# Patient Record
Sex: Male | Born: 1950
Health system: Southern US, Community
[De-identification: ages and names within clinical notes are randomized; demographics above are authoritative.]

## PROBLEM LIST (undated history)

## (undated) DIAGNOSIS — E785 Hyperlipidemia, unspecified: Secondary | ICD-10-CM

## (undated) DIAGNOSIS — E7800 Pure hypercholesterolemia, unspecified: Secondary | ICD-10-CM

## (undated) DIAGNOSIS — N529 Male erectile dysfunction, unspecified: Secondary | ICD-10-CM

## (undated) DIAGNOSIS — R609 Edema, unspecified: Secondary | ICD-10-CM

## (undated) DIAGNOSIS — I10 Essential (primary) hypertension: Secondary | ICD-10-CM

## (undated) HISTORY — DX: Edema, unspecified: R60.9

## (undated) HISTORY — DX: Pure hypercholesterolemia, unspecified: E78.00

## (undated) HISTORY — DX: Male erectile dysfunction, unspecified: N52.9

## (undated) HISTORY — PX: FOOT SURGERY: SHX648

## (undated) HISTORY — PX: COLONOSCOPY: SHX174

## (undated) HISTORY — DX: Hyperlipidemia, unspecified: E78.5

## (undated) HISTORY — DX: Essential (primary) hypertension: I10

---

## 1997-09-28 ENCOUNTER — Ambulatory Visit (HOSPITAL_COMMUNITY): Admission: RE | Admit: 1997-09-28 | Discharge: 1997-09-28 | Payer: Self-pay | Admitting: Family Medicine

## 1997-12-28 ENCOUNTER — Encounter: Admission: RE | Admit: 1997-12-28 | Discharge: 1997-12-28 | Payer: Self-pay | Admitting: *Deleted

## 2002-07-12 ENCOUNTER — Ambulatory Visit (HOSPITAL_COMMUNITY): Admission: RE | Admit: 2002-07-12 | Discharge: 2002-07-12 | Payer: Self-pay | Admitting: Family Medicine

## 2002-07-12 ENCOUNTER — Encounter: Payer: Self-pay | Admitting: Family Medicine

## 2005-07-25 ENCOUNTER — Emergency Department (HOSPITAL_COMMUNITY): Admission: EM | Admit: 2005-07-25 | Discharge: 2005-07-25 | Payer: Self-pay | Admitting: Emergency Medicine

## 2006-10-14 ENCOUNTER — Emergency Department (HOSPITAL_COMMUNITY): Admission: EM | Admit: 2006-10-14 | Discharge: 2006-10-14 | Payer: Self-pay | Admitting: Emergency Medicine

## 2007-08-20 ENCOUNTER — Encounter: Admission: RE | Admit: 2007-08-20 | Discharge: 2007-08-20 | Payer: Self-pay | Admitting: Interventional Cardiology

## 2008-04-05 ENCOUNTER — Ambulatory Visit (HOSPITAL_COMMUNITY): Admission: RE | Admit: 2008-04-05 | Discharge: 2008-04-05 | Payer: Self-pay | Admitting: Urology

## 2008-09-13 ENCOUNTER — Encounter (INDEPENDENT_AMBULATORY_CARE_PROVIDER_SITE_OTHER): Payer: Self-pay | Admitting: *Deleted

## 2010-11-06 NOTE — Op Note (Signed)
NAMEJOSHUE, BADAL               ACCOUNT NO.:  [id number]   MEDICAL RECORD NO.:  [id number]          PATIENT TYPE:  AMB   LOCATION:  DAY                          FACILITY:  [facility]   PHYSICIAN:  Excell Seltzer. Annabell Howells, M.D.    DATE OF BIRTH:  [birth year]   DATE OF PROCEDURE:  04/05/2008  DATE OF DISCHARGE:                               OPERATIVE REPORT   PROCEDURE:  Bilateral hydrocelectomy.   PREOPERATIVE DIAGNOSIS:  Bilateral hydroceles.   POSTOPERATIVE DIAGNOSIS:  Bilateral hydroceles.   SURGEON:  Excell Seltzer. Annabell Howells, MD   ANESTHESIA:  General.   SPECIMEN:  None.   DRAINS:  Bilateral 1/4-inch Penrose scrotal drains.   COMPLICATIONS:  None.   INDICATIONS:  Mr. Tony is a 60 year old white male with two large  hydroceles, left greater than right, who has elected surgical  correction.   FINDINGS AND PROCEDURE:  The patient is taken to the operating room and  general anesthetic was induced.  His scrotum was clipped.  He was  prepped with Betadine solution and draped in the usual sterile fashion.  A midline incision was made along the scrotal raphe with a knife.  This  was carried to the dartos with the Bovie.  Initially, the left hydrocele  sac was exposed and delivered from the wound.  The sac was drained of  700 mL of yellow fluid and the excess sac was excised and the remainder  was imbricated behind the testicle in a water bottle fashion using a  running locked 3-0 chromic suture.  The left testicle was returned to  the left hemi scrotum once hemostasis was assured.  The right testicle  was then delivered through the same incision and the hydrocele was  drained.  The right hydrocele had 200 mL of yellow fluid.  The excess  sac was excised.  The residual sac was imbricated behind the testicle in  a water bottle fashion with a running locked 3-0 chromic suture.  The  right testicle was returned to the scrotum once hemostasis was assured.  A 1/4-inch Penrose drains were placed through  separate stab wounds in  the dependent portion of each hemi scrotum and were temporarily secured  with towel clips.  The wound was then closed in two layers using a  running 3-0 chromic on the dartos layer and running vertical mattress 3-  0 chromic on the skin with great care being taken to avoid entrapment of  the drains.  Once the incision had been closed, the wound was  cleansed.  The towel clips were removed from the drains and a 4 x 4s  were placed followed by fluff Kerlix and a scrotal support.  The  patient's anesthetic was reversed, he was moved to the recovery room in  stable condition.  There were no complications.      Excell Seltzer. Annabell Howells, M.D.  Electronically Signed     JJW/MEDQ  D:  04/05/2008  T:  04/06/2008  Job:  [id number]

## 2011-03-25 LAB — BASIC METABOLIC PANEL
BUN: 11
CO2: 32
Calcium: 9.4
Chloride: 104
Creatinine, Ser: 1.06
GFR calc Af Amer: >60
GFR calc non Af Amer: >60
Glucose, Bld: 111 — ABNORMAL HIGH
Potassium: 3.7
Sodium: 141

## 2011-03-25 LAB — HEMOGLOBIN AND HEMATOCRIT, BLOOD
HCT: 45.4
Hemoglobin: 14.9

## 2013-03-24 ENCOUNTER — Encounter: Payer: Self-pay | Admitting: *Deleted

## 2013-03-24 ENCOUNTER — Encounter: Payer: Self-pay | Admitting: Interventional Cardiology

## 2013-03-24 DIAGNOSIS — R609 Edema, unspecified: Secondary | ICD-10-CM | POA: Insufficient documentation

## 2013-03-24 DIAGNOSIS — I10 Essential (primary) hypertension: Secondary | ICD-10-CM | POA: Insufficient documentation

## 2013-03-24 DIAGNOSIS — N529 Male erectile dysfunction, unspecified: Secondary | ICD-10-CM | POA: Insufficient documentation

## 2013-03-24 DIAGNOSIS — E785 Hyperlipidemia, unspecified: Secondary | ICD-10-CM | POA: Insufficient documentation

## 2013-03-24 DIAGNOSIS — E7800 Pure hypercholesterolemia, unspecified: Secondary | ICD-10-CM | POA: Insufficient documentation

## 2013-03-31 ENCOUNTER — Encounter: Payer: Self-pay | Admitting: Interventional Cardiology

## 2013-03-31 ENCOUNTER — Ambulatory Visit (INDEPENDENT_AMBULATORY_CARE_PROVIDER_SITE_OTHER): Payer: Managed Care, Other (non HMO) | Admitting: Interventional Cardiology

## 2013-03-31 VITALS — BP 174/102 | HR 99 | Ht 67.0 in | Wt 205.0 lb

## 2013-03-31 DIAGNOSIS — I10 Essential (primary) hypertension: Secondary | ICD-10-CM

## 2013-03-31 DIAGNOSIS — E785 Hyperlipidemia, unspecified: Secondary | ICD-10-CM

## 2013-03-31 DIAGNOSIS — Z79899 Other long term (current) drug therapy: Secondary | ICD-10-CM

## 2013-03-31 DIAGNOSIS — N529 Male erectile dysfunction, unspecified: Secondary | ICD-10-CM

## 2013-03-31 NOTE — Progress Notes (Signed)
Patient ID: Benjamin Vega, male   DOB: Jun 24, 1951, 62 y.o.   MRN: [id number]    [address] [place], [place]  [address] Phone: [phone] Fax:  [phone]  Date:  04/02/2013   ID:  Benjamin Vega, DOB [birth year], MRN [id number]  PCP:  No primary provider on file.      History of Present Illness: [name] is a 62 y.o. male who has needed multiple medications to control BP. Some fatigue in the past, but better on less coreg. Hypertension: rare edema in the legs.  Rare facial swelling.   c/o Chest pain Only with over eating. No relation to exertion..  Denies : Dizziness.  .  Orthopnea.  Paroxysmal nocturnal dyspnea.  Palpitations.  Dyspnea.  Syncope.     Wt Readings from Last 3 Encounters:  03/31/13 205 lb (92.987 kg)     Past Medical History  Diagnosis Date  . Erectile dysfunction   . Edema   . HTN (hypertension)   . Hypercholesteremia   . Hyperlipemia     Current Outpatient Prescriptions  Medication Sig Dispense Refill  . amlodipine-atorvastatin (CADUET) 10-10 MG per tablet Take 1 tablet by mouth daily.      Marland Kitchen aspirin 325 MG tablet Take 325 mg by mouth daily.      . carvedilol (COREG) 12.5 MG tablet Take 6.25 mg by mouth 2 (two) times daily with a meal.      . olmesartan (BENICAR) 40 MG tablet Take 40 mg by mouth daily.      Marland Kitchen spironolactone (ALDACTONE) 25 MG tablet Take 25 mg by mouth daily.      . tadalafil (CIALIS) 20 MG tablet Take 20 mg by mouth daily as needed for erectile dysfunction.      . timolol (TIMOPTIC) 0.5 % ophthalmic solution        No current facility-administered medications for this visit.    Allergies:   Not on File, question of angioedema to ACE-I.  Has tolerated ARB.  Social History:  The patient  reports that he has quit smoking. He does not have any smokeless tobacco history on file.   Family History:  The patient's family history includes Diabetes in his mother; Hypertension in his father.   ROS:  Please see  the history of present illness.  No nausea, vomiting.  No fevers, chills.  No focal weakness.  No dysuria.  All other systems reviewed and negative.   PHYSICAL EXAM: VS:  BP 174/102  Pulse 99  Ht 5\' 7"  (1.702 m)  Wt 205 lb (92.987 kg)  BMI 32.1 kg/m2 Well nourished, well developed, in no acute distress HEENT: normal Neck: no JVD, no carotid bruits Cardiac:  normal S1, S2; RRR;  Lungs:  clear to auscultation bilaterally, no wheezing, rhonchi or rales Abd: soft, nontender, no hepatomegaly Ext: no edema Skin: warm and dry Neuro:   no focal abnormalities noted    ASSESSMENT AND PLAN:  Treatment  1. Hypertension, essential  Continue Benicar Tablet, 40 MG, 1 tablet, Orally, Once a day, 90, Refills 3 Continue Coreg Tablet, 6.25 MG, one tablet, Orally, Twice a day, 180, Refills 3 Continue Spironolactone Tablet, 25 MG, 1 tablet, Orally, daily, 90, Refills 3 Diagnostic Imaging:EKG Harward,Amy 07/06/2012 03:40:41 PM > VARANASI,JAY 07/06/2012 04:09:08 PM > NSR, no ST segment changes  Well controlled. Most readings in the 110-120 range systolic. COuld try reducing Coreg to half tab (3.125 mg) twice a day, especially given ED. If BP rises,  would go back up to 6.25 mg BID. He will try if his readings remain low.    2. Mixed hyperlipidemia Lipids in Jan 2014 were reviewed and well controlled.  Continue Caduet Tablet, 10-10 MG, 1 tablet, Orally, qd, 90, Refills 3 LAB: Comp Metabolic Panel (Ordered for 07/08/2012)   GLUCOSE 99 70-99 - mg/dL   BUN 18 4-09 - mg/dL   CREATININE 8.11 9.14-7.82 - mg/dl   eGFR (NON-AFRICAN AMERICAN) 76 >60 - calc   eGFR (AFRICAN AMERICAN) 92 >60 - calc   SODIUM 140 136-145 - mmol/L   POTASSIUM 4.0 3.5-5.5 - mmol/L   CHLORIDE 103 98-107 - mmol/L   C02 28 22-32 - mmol/L   ANION GAP 12.6 6.0-20.0 - mmol/L   CALCIUM 9.4 8.6-10.3 - mg/dL   T PROTEIN 7.4 9.5-6.2 - g/dL   ALBUMIN 4.5 1.3-0.8 - g/dL   T.BILI 0.6 0.3-1.0 - mg/dL   ALP 58 65-784 - U/L   AST 26 0-39 -  U/L   ALT 26 0-52 - U/L    VARANASI,JAY 07/09/2012 11:30:25 AM > normal kidney and liver function. Harward,Amy 07/09/2012 02:17:27 PM > lmtrc Harward,Amy 07/09/2012 04:48:29 PM > Pt notified.   LAB: Lipid Panel w/Direct LDL (Ordered for 07/08/2012)   CHOLESTEROL 127 <200 - mg/dL   TRIG 73 6-962 - mg/dL   DIRECT HDL 47 95-28 - mg/dL   DLDL 61 4-13 - mg/dL   NON-HDL 80 2-440 - mg/dL   CHOL/HDL 2.7 1.0-2.7 - Ratio    VARANASI,JAY 07/09/2012 11:29:38 AM > normal. continue current meds. Harward,Amy 07/09/2012 02:17:21 PM > lmtrc Harward,Amy 07/09/2012 04:48:01 PM > Pt notified.   LDL 60.    3. Erectile dysfunction  Continue Cialis Tablet, 20 MG, TAKE 1 TABLET BY MOUTH 1 HOUR PRIOR TO SEXUAL ACTIVITY, oral, prn, 30 days, 10, Refills 3 OK to continue this. Not using NTG.      Signed, Fredric Mare, MD, [facility] 04/02/2013 2:24 PM

## 2013-03-31 NOTE — Patient Instructions (Signed)
Your physician wants you to follow-up in: 1 year with Dr. Eldridge Dace. You will receive a reminder letter in the mail two months in advance. If you don't receive a letter, please call our office to schedule the follow-up appointment.  Your physician recommends that you return for lab work in: today for bmet, lipid and alt. Also, in 6 months for a BMET.  Your physician recommends that you continue on your current medications as directed. Please refer to the Current Medication list given to you today.

## 2013-04-07 ENCOUNTER — Other Ambulatory Visit (INDEPENDENT_AMBULATORY_CARE_PROVIDER_SITE_OTHER): Payer: Managed Care, Other (non HMO)

## 2013-04-07 DIAGNOSIS — Z79899 Other long term (current) drug therapy: Secondary | ICD-10-CM

## 2013-04-07 LAB — BASIC METABOLIC PANEL
BUN: 15 mg/dL (ref 6–23)
Calcium: 9.2 mg/dL (ref 8.4–10.5)
Chloride: 100 mEq/L (ref 96–112)
Creatinine, Ser: 1.1 mg/dL (ref 0.4–1.5)
GFR: 84.87 mL/min (ref >60.00)
Sodium: 139 mEq/L (ref 135–145)

## 2013-04-08 ENCOUNTER — Telehealth: Payer: Self-pay | Admitting: Interventional Cardiology

## 2013-04-08 NOTE — Telephone Encounter (Signed)
New problem     Pt called about labs 10/15 please give him a call back

## 2013-08-12 ENCOUNTER — Other Ambulatory Visit: Payer: Self-pay | Admitting: *Deleted

## 2013-08-12 ENCOUNTER — Telehealth: Payer: Self-pay | Admitting: Interventional Cardiology

## 2013-08-12 MED ORDER — AMLODIPINE-ATORVASTATIN 10-10 MG PO TABS: 1.0000 | ORAL_TABLET | Freq: Every day | ORAL | Status: DC

## 2013-08-12 MED ORDER — CARVEDILOL 12.5 MG PO TABS: 6.2500 mg | ORAL_TABLET | Freq: Two times a day (BID) | ORAL | Status: DC

## 2013-08-12 MED ORDER — OLMESARTAN MEDOXOMIL 40 MG PO TABS: 40.0000 mg | ORAL_TABLET | Freq: Every day | ORAL | Status: DC

## 2013-08-12 MED ORDER — SPIRONOLACTONE 25 MG PO TABS: 25.0000 mg | ORAL_TABLET | Freq: Every day | ORAL | Status: DC

## 2013-09-01 ENCOUNTER — Other Ambulatory Visit: Payer: Self-pay | Admitting: Cardiology

## 2013-09-01 MED ORDER — TADALAFIL 20 MG PO TABS: 20.0000 mg | ORAL_TABLET | Freq: Every day | ORAL | Status: DC | PRN

## 2013-09-29 ENCOUNTER — Ambulatory Visit (INDEPENDENT_AMBULATORY_CARE_PROVIDER_SITE_OTHER): Payer: BC Managed Care – PPO | Admitting: *Deleted

## 2013-09-29 DIAGNOSIS — I10 Essential (primary) hypertension: Secondary | ICD-10-CM

## 2013-09-29 LAB — BASIC METABOLIC PANEL
BUN: 21 mg/dL (ref 6–23)
CO2: 28 meq/L (ref 19–32)
CREATININE: 1 mg/dL (ref 0.4–1.5)
Calcium: 9.7 mg/dL (ref 8.4–10.5)
Chloride: 103 mEq/L (ref 96–112)
GFR: 98.87 mL/min (ref >60.00)
Glucose, Bld: 109 mg/dL — ABNORMAL HIGH (ref 70–99)
POTASSIUM: 3.8 meq/L (ref 3.5–5.1)
Sodium: 138 mEq/L (ref 135–145)

## 2013-10-20 NOTE — Telephone Encounter (Signed)
error 

## 2013-12-27 ENCOUNTER — Other Ambulatory Visit: Payer: Self-pay | Admitting: *Deleted

## 2013-12-27 MED ORDER — CARVEDILOL 6.25 MG PO TABS: 6.2500 mg | ORAL_TABLET | Freq: Two times a day (BID) | ORAL | Status: DC

## 2014-02-09 ENCOUNTER — Other Ambulatory Visit: Payer: Self-pay | Admitting: Cardiology

## 2014-02-09 MED ORDER — TADALAFIL 20 MG PO TABS: 20.0000 mg | ORAL_TABLET | Freq: Every day | ORAL | Status: DC | PRN

## 2014-02-10 NOTE — Telephone Encounter (Signed)
error 

## 2014-03-18 ENCOUNTER — Ambulatory Visit (INDEPENDENT_AMBULATORY_CARE_PROVIDER_SITE_OTHER): Payer: BC Managed Care – PPO | Admitting: Cardiology

## 2014-03-18 ENCOUNTER — Encounter: Payer: Self-pay | Admitting: Cardiology

## 2014-03-18 ENCOUNTER — Ambulatory Visit: Payer: BC Managed Care – PPO

## 2014-03-18 VITALS — BP 130/88 | HR 61 | Ht 67.0 in | Wt 213.0 lb

## 2014-03-18 DIAGNOSIS — I10 Essential (primary) hypertension: Secondary | ICD-10-CM

## 2014-03-18 DIAGNOSIS — R7309 Other abnormal glucose: Secondary | ICD-10-CM

## 2014-03-18 DIAGNOSIS — Z79899 Other long term (current) drug therapy: Secondary | ICD-10-CM

## 2014-03-18 DIAGNOSIS — Z833 Family history of diabetes mellitus: Secondary | ICD-10-CM

## 2014-03-18 DIAGNOSIS — N528 Other male erectile dysfunction: Secondary | ICD-10-CM

## 2014-03-18 DIAGNOSIS — N529 Male erectile dysfunction, unspecified: Secondary | ICD-10-CM

## 2014-03-18 LAB — COMPREHENSIVE METABOLIC PANEL
ALT: 22 U/L (ref 0–53)
AST: 24 U/L (ref 0–37)
Albumin: 4.4 g/dL (ref 3.5–5.2)
Alkaline Phosphatase: 53 U/L (ref 39–117)
BUN: 20 mg/dL (ref 6–23)
CO2: 27 mEq/L (ref 19–32)
Calcium: 9.9 mg/dL (ref 8.4–10.5)
Chloride: 102 mEq/L (ref 96–112)
Creatinine, Ser: 1.2 mg/dL (ref 0.4–1.5)
GFR: 79.68 mL/min (ref >60.00)
Glucose, Bld: 115 mg/dL — ABNORMAL HIGH (ref 70–99)
Potassium: 4.2 mEq/L (ref 3.5–5.1)
Sodium: 136 mEq/L (ref 135–145)
Total Bilirubin: 0.5 mg/dL (ref 0.2–1.2)
Total Protein: 7.7 g/dL (ref 6.0–8.3)

## 2014-03-18 LAB — CBC WITH DIFFERENTIAL/PLATELET
Basophils Absolute: 0 10*3/uL (ref 0.0–0.1)
Basophils Relative: 0.4 % (ref 0.0–3.0)
Eosinophils Absolute: 0.1 10*3/uL (ref 0.0–0.7)
Eosinophils Relative: 2.2 % (ref 0.0–5.0)
HCT: 45.1 % (ref 39.0–52.0)
Hemoglobin: 14.7 g/dL (ref 13.0–17.0)
Lymphocytes Relative: 27.9 % (ref 12.0–46.0)
Lymphs Abs: 1.1 10*3/uL (ref 0.7–4.0)
MCHC: 32.6 g/dL (ref 30.0–36.0)
MCV: 94 fl (ref 78.0–100.0)
Monocytes Absolute: 0.4 10*3/uL (ref 0.1–1.0)
Monocytes Relative: 9.7 % (ref 3.0–12.0)
Neutro Abs: 2.4 10*3/uL (ref 1.4–7.7)
Neutrophils Relative %: 59.8 % (ref 43.0–77.0)
Platelets: 206 10*3/uL (ref 150.0–400.0)
RBC: 4.8 Mil/uL (ref 4.22–5.81)
RDW: 15.8 % — ABNORMAL HIGH (ref 11.5–15.5)
WBC: 4.1 10*3/uL (ref 4.0–10.5)

## 2014-03-18 LAB — HEMOGLOBIN A1C: Hgb A1c MFr Bld: 6.4 % (ref 4.6–6.5)

## 2014-03-18 NOTE — Assessment & Plan Note (Signed)
[place]ais prn

## 2014-03-18 NOTE — Assessment & Plan Note (Signed)
On Caduet

## 2014-03-18 NOTE — Assessment & Plan Note (Signed)
Controlled.  

## 2014-03-18 NOTE — Assessment & Plan Note (Signed)
His last glucose was slightly elevated and the pt asked to be checked for DM.

## 2014-03-18 NOTE — Patient Instructions (Signed)
Your physician recommends that you continue on your current medications as directed. Please refer to the Current Medication list given to you today.  Your physician recommends that you go to the lab today for a CBC, BMET, and Hgb A1C  Your physician wants you to follow-up in: 1 year with Dr Glennon Hamilton will receive a reminder letter in the mail two months in advance. If you don't receive a letter, please call our office to schedule the follow-up appointment.

## 2014-03-18 NOTE — Progress Notes (Signed)
    03/18/2014 Benjamin Vega   1951-03-16  [id number]  Primary Physicia No primary provider on file. Primary Cardiologist: Dr Irish Lack  HPI:  63 y/o followed by Dr Irish Lack and Dr Jeremy Johann. He has a history of HTN that had previously been difficult to control. He is her for an annual check up. He denies any chest pain or dyspnea. He brought in a record of his B/P at home and his B/P is well controlled.    Current Outpatient Prescriptions  Medication Sig Dispense Refill  . amlodipine-atorvastatin (CADUET) 10-10 MG per tablet Take 1 tablet by mouth daily.  90 tablet  2  . aspirin 325 MG tablet Take 325 mg by mouth daily.      . carvedilol (COREG) 6.25 MG tablet Take 1 tablet (6.25 mg total) by mouth 2 (two) times daily.  180 tablet  0  . COMBIGAN 0.2-0.5 % ophthalmic solution       . olmesartan (BENICAR) 40 MG tablet Take 1 tablet (40 mg total) by mouth daily.  90 tablet  2  . spironolactone (ALDACTONE) 25 MG tablet Take 1 tablet (25 mg total) by mouth daily.  90 tablet  2  . tadalafil (CIALIS) 20 MG tablet Take 1 tablet (20 mg total) by mouth daily as needed for erectile dysfunction.  10 tablet  3  . timolol (TIMOPTIC) 0.5 % ophthalmic solution        No current facility-administered medications for this visit.    No Known Allergies  History   Social History  . Marital Status: Married    Spouse Name: N/A    Number of Children: N/A  . Years of Education: N/A   Occupational History  . Not on file.   Social History Main Topics  . Smoking status: Former Research scientist (life sciences)  . Smokeless tobacco: Not on file  . Alcohol Use: Not on file  . Drug Use: Not on file  . Sexual Activity: Not on file   Other Topics Concern  . Not on file   Social History Narrative  . No narrative on file     Review of Systems: General: negative for chills, fever, night sweats or weight changes.  Cardiovascular: negative for chest pain, dyspnea on exertion, edema, orthopnea, palpitations, paroxysmal  nocturnal dyspnea or shortness of breath Dermatological: negative for rash Respiratory: negative for cough or wheezing Urologic: negative for hematuria Abdominal: negative for nausea, vomiting, diarrhea, bright red blood per rectum, melena, or hematemesis Neurologic: negative for visual changes, syncope, or dizziness All other systems reviewed and are otherwise negative except as noted above.    Blood pressure 130/88, pulse 61, height 5\' 7"  (1.702 m), weight 213 lb (96.616 kg).  General appearance: alert, cooperative and no distress Neck: no carotid bruit and no JVD Lungs: clear to auscultation bilaterally Heart: regular rate and rhythm Extremities: no edema  EKG NSR  ASSESSMENT AND PLAN:   HTN (hypertension) Controlled  Family history of diabetes mellitus His last glucose was slightly elevated and the pt asked to be checked for DM.  Hypercholesteremia On Caduet    PLAN  I ordered a CMET, CBC and HGb A1c today. He will follow up with Dr Irish Lack in a year. If his HGb A1c suggests diabetes he will need aggressive lipid control.   Vincenzina Jagoda KPA-C 03/18/2014 10:00 AM

## 2014-04-25 ENCOUNTER — Other Ambulatory Visit: Payer: Self-pay | Admitting: Interventional Cardiology

## 2014-05-04 ENCOUNTER — Encounter: Payer: Self-pay | Admitting: Gastroenterology

## 2014-05-20 ENCOUNTER — Other Ambulatory Visit: Payer: Self-pay | Admitting: Interventional Cardiology

## 2014-06-06 ENCOUNTER — Other Ambulatory Visit: Payer: Self-pay | Admitting: Interventional Cardiology

## 2014-07-11 ENCOUNTER — Ambulatory Visit (INDEPENDENT_AMBULATORY_CARE_PROVIDER_SITE_OTHER): Payer: BLUE CROSS/BLUE SHIELD | Admitting: Gastroenterology

## 2014-07-11 ENCOUNTER — Encounter: Payer: Self-pay | Admitting: Gastroenterology

## 2014-07-11 VITALS — BP 140/88 | HR 68 | Ht 67.0 in | Wt 213.0 lb

## 2014-07-11 DIAGNOSIS — Z1211 Encounter for screening for malignant neoplasm of colon: Secondary | ICD-10-CM

## 2014-07-11 DIAGNOSIS — K5900 Constipation, unspecified: Secondary | ICD-10-CM

## 2014-07-11 NOTE — Assessment & Plan Note (Signed)
Patient will be scheduled for colonoscopy 

## 2014-07-11 NOTE — Assessment & Plan Note (Signed)
Patient has mild constipation.  He was encouraged to increase fiber supplementation

## 2014-07-11 NOTE — Patient Instructions (Signed)
You have been scheduled for a colonoscopy. Please follow written instructions given to you at your visit today.  Please pick up your prep kit at the pharmacy within the next 1-3 days. If you use inhalers (even only as needed), please bring them with you on the day of your procedure. Your physician has requested that you go to www.startemmi.com and enter the access code given to you at your visit today. This web site gives a general overview about your procedure. However, you should still follow specific instructions given to you by our office regarding your preparation for the procedure.  We have given you a Suprep sample kit today

## 2014-07-11 NOTE — Progress Notes (Signed)
    _                                                                                                                History of Present Illness:  Benjamin Vega is a 64 year old Afro-American male referred for colonoscopy.  The patient complains of mild constipation and excess gas.  Denies abdominal pain or rectal bleeding.  Colonoscopy in 2005 was normal.   Past Medical History  Diagnosis Date  . Erectile dysfunction   . Edema   . HTN (hypertension)   . Hypercholesteremia   . Hyperlipemia    Past Surgical History  Procedure Laterality Date  . Colonoscopy     family history includes Diabetes in his mother; Hypertension in his father. Current Outpatient Prescriptions  Medication Sig Dispense Refill  . amlodipine-atorvastatin (CADUET) 10-10 MG per tablet TAKE 1 TABLET DAILY 90 tablet 2  . aspirin 325 MG tablet Take 325 mg by mouth daily.    Marland Kitchen BENICAR 40 MG tablet TAKE 1 TABLET DAILY 90 tablet 2  . carvedilol (COREG) 6.25 MG tablet TAKE 1 TABLET TWICE A DAY 180 tablet 2  . COMBIGAN 0.2-0.5 % ophthalmic solution     . spironolactone (ALDACTONE) 25 MG tablet TAKE 1 TABLET DAILY 90 tablet 2  . tadalafil (CIALIS) 20 MG tablet Take 1 tablet (20 mg total) by mouth daily as needed for erectile dysfunction. 10 tablet 3  . timolol (TIMOPTIC) 0.5 % ophthalmic solution      No current facility-administered medications for this visit.   Allergies as of 07/11/2014  . (No Known Allergies)    reports that he has quit smoking. He does not have any smokeless tobacco history on file. His alcohol and drug histories are not on file.   Review of Systems: He complains of low back pain Pertinent positive and negative review of systems were noted in the above HPI section. All other review of systems were otherwise negative.  Vital signs were reviewed in today's medical record Physical Exam: General: Well developed , well nourished, no acute distress Skin: anicteric Head: Normocephalic and  atraumatic Eyes:  sclerae anicteric, EOMI Ears: Normal auditory acuity Mouth: No deformity or lesions Neck: Supple, no masses or thyromegaly Lungs: Clear throughout to auscultation Heart: Regular rate and rhythm; no murmurs, rubs or bruits Abdomen: Soft, non tender and non distended. No masses, hepatosplenomegaly or hernias noted. Normal Bowel sounds Rectal:deferred Musculoskeletal: Symmetrical with no gross deformities  Skin: No lesions on visible extremities Pulses:  Normal pulses noted Extremities: No clubbing, cyanosis, edema or deformities noted Neurological: Alert oriented x 4, grossly nonfocal Cervical Nodes:  No significant cervical adenopathy Inguinal Nodes: No significant inguinal adenopathy Psychological:  Alert and cooperative. Normal mood and affect  See Assessment and Plan under Problem List

## 2014-07-11 NOTE — Addendum Note (Signed)
Addended by: Oda Kilts on: 07/11/2014 09:13 AM   Modules accepted: Orders

## 2014-07-14 ENCOUNTER — Ambulatory Visit (AMBULATORY_SURGERY_CENTER): Payer: BLUE CROSS/BLUE SHIELD | Admitting: Gastroenterology

## 2014-07-14 ENCOUNTER — Encounter: Payer: Self-pay | Admitting: Gastroenterology

## 2014-07-14 VITALS — BP 116/70 | HR 54 | Temp 97.0°F | Resp 12 | Ht 67.0 in | Wt 213.0 lb

## 2014-07-14 DIAGNOSIS — Z1211 Encounter for screening for malignant neoplasm of colon: Secondary | ICD-10-CM

## 2014-07-14 DIAGNOSIS — K5730 Diverticulosis of large intestine without perforation or abscess without bleeding: Secondary | ICD-10-CM

## 2014-07-14 MED ORDER — SODIUM CHLORIDE 0.9 % IV SOLN: 500.0000 mL | INTRAVENOUS | Status: DC

## 2014-07-14 NOTE — Op Note (Signed)
[place]  [address] [place], [address]   COLONOSCOPY PROCEDURE REPORT  PATIENT: Benjamin Vega, Benjamin Vega  MR#: [id number] BIRTHDATE: 1950/12/15 , 49  yrs. old GENDER: male ENDOSCOPIST: Inda Castle, MD REFERRED BY: PROCEDURE DATE:  07/14/2014 PROCEDURE:   Colonoscopy, diagnostic First Screening Colonoscopy - Avg.  risk and is 50 yrs.  old or older - No.  Prior Negative Screening - Now for repeat screening. N/A Prior Negative Screening - Now for repeat screening.  10 or more years since last screening  History of Adenoma - Now for follow-up colonoscopy & has been > or = to 3 yrs.  Polyps Removed Today? No.  Recommend repeat exam, <10 yrs? No. ASA CLASS:   Class II INDICATIONS:average risk for colon cancer. MEDICATIONS: Monitored anesthesia care and Propofol 250 mg IV  DESCRIPTION OF PROCEDURE:   After the risks benefits and alternatives of the procedure were thoroughly explained, informed consent was obtained.  The digital rectal exam revealed no abnormalities of the rectum.   The [id number]  endoscope was introduced through the anus and advanced to the cecum, which was identified by both the appendix and ileocecal valve. No adverse events experienced.   The quality of the prep was Suprep good  The instrument was then slowly withdrawn as the colon was fully examined.      COLON FINDINGS: There was moderate diverticulosis noted in the descending colon and sigmoid colon.   The examination was otherwise normal.  Retroflexed views revealed no abnormalities. The time to cecum=8 minutes 05 seconds.  Withdrawal time=6 minutes 50 seconds. The scope was withdrawn and the procedure completed. COMPLICATIONS: There were no immediate complications.  ENDOSCOPIC IMPRESSION: 1.   Moderate diverticulosis was noted in the descending colon and sigmoid colon 2.   The examination was otherwise normal  RECOMMENDATIONS: Continue current colorectal screening  recommendations for "routine risk" patients with a repeat colonoscopy in 10 years.  eSigned:  Inda Castle, MD 07/14/2014 2:21 PM   cc: Pia Mau, MD

## 2014-07-14 NOTE — Patient Instructions (Signed)

## 2014-07-18 ENCOUNTER — Telehealth: Payer: Self-pay | Admitting: *Deleted

## 2014-07-18 NOTE — Telephone Encounter (Signed)
  Follow up Call-  Call back number 07/14/2014  Post procedure Call Back phone  # [phone]  Permission to leave phone message Yes     Patient questions:  Do you have a fever, pain , or abdominal swelling? No. Pain Score  0 *  Have you tolerated food without any problems? Yes.    Have you been able to return to your normal activities? Yes.    Do you have any questions about your discharge instructions: Diet   No. Medications  No. Follow up visit  No.  Do you have questions or concerns about your Care? No.  Actions: * If pain score is 4 or above: No action needed, pain <4.  Spoke with pts wife.  Patient was at work.

## 2014-12-23 ENCOUNTER — Other Ambulatory Visit: Payer: Self-pay | Admitting: Interventional Cardiology

## 2014-12-27 NOTE — Telephone Encounter (Signed)
Pt last seen 02/2014 by Kerin Ransom, PA. Dr. Hoy Finlay you ok with refilling?

## 2014-12-27 NOTE — Telephone Encounter (Signed)
OK to refill

## 2015-01-23 ENCOUNTER — Other Ambulatory Visit: Payer: Self-pay

## 2015-01-23 MED ORDER — AMLODIPINE-ATORVASTATIN 10-10 MG PO TABS: 1.0000 | ORAL_TABLET | Freq: Every day | ORAL | Status: DC

## 2015-01-23 NOTE — Telephone Encounter (Signed)
Pt needs to call and schedule follow up office visit to get 90 day supply sent to [facility]--refilled last time up to February 21 2015

## 2015-01-23 NOTE — Telephone Encounter (Signed)
Last OV 03/18/2014--can not refill 90 day supply without a upcoming scheduled Hamer, PA-C at 03/18/2014 9:58 AM  amlodipine-atorvastatin (CADUET) 10-10 MG per tablet Take 1 tablet by mouth daily         Hypercholesteremia On Caduet  Patient Instructions     Your physician recommends that you continue on your current medications as directed. Please refer to the Current Medication list given to you today.

## 2015-01-25 ENCOUNTER — Other Ambulatory Visit: Payer: Self-pay

## 2015-01-25 MED ORDER — OLMESARTAN MEDOXOMIL 40 MG PO TABS: 40.0000 mg | ORAL_TABLET | Freq: Every day | ORAL | Status: DC

## 2015-01-25 MED ORDER — SPIRONOLACTONE 25 MG PO TABS: 25.0000 mg | ORAL_TABLET | Freq: Every day | ORAL | Status: DC

## 2015-01-25 MED ORDER — AMLODIPINE-ATORVASTATIN 10-10 MG PO TABS: 1.0000 | ORAL_TABLET | Freq: Every day | ORAL | Status: DC

## 2015-04-06 ENCOUNTER — Encounter: Payer: Self-pay | Admitting: Interventional Cardiology

## 2015-04-06 ENCOUNTER — Ambulatory Visit (INDEPENDENT_AMBULATORY_CARE_PROVIDER_SITE_OTHER): Payer: BLUE CROSS/BLUE SHIELD | Admitting: Interventional Cardiology

## 2015-04-06 VITALS — BP 144/98 | HR 65 | Ht 67.0 in | Wt 209.2 lb

## 2015-04-06 DIAGNOSIS — E7800 Pure hypercholesterolemia, unspecified: Secondary | ICD-10-CM | POA: Diagnosis not present

## 2015-04-06 DIAGNOSIS — Z833 Family history of diabetes mellitus: Secondary | ICD-10-CM

## 2015-04-06 DIAGNOSIS — E785 Hyperlipidemia, unspecified: Secondary | ICD-10-CM

## 2015-04-06 DIAGNOSIS — I10 Essential (primary) hypertension: Secondary | ICD-10-CM | POA: Diagnosis not present

## 2015-04-06 MED ORDER — CARVEDILOL 6.25 MG PO TABS: 6.2500 mg | ORAL_TABLET | Freq: Two times a day (BID) | ORAL | Status: DC

## 2015-04-06 MED ORDER — OLMESARTAN MEDOXOMIL 40 MG PO TABS: 40.0000 mg | ORAL_TABLET | Freq: Every day | ORAL | Status: DC

## 2015-04-06 MED ORDER — AMLODIPINE-ATORVASTATIN 10-10 MG PO TABS: 1.0000 | ORAL_TABLET | Freq: Every day | ORAL | Status: DC

## 2015-04-06 MED ORDER — SPIRONOLACTONE 25 MG PO TABS: 25.0000 mg | ORAL_TABLET | Freq: Every day | ORAL | Status: DC

## 2015-04-06 NOTE — Addendum Note (Signed)
**Note De-Identified Elyanah Farino Obfuscation** Addended by: Dennie Fetters on: 04/06/2015 03:14 PM   Modules accepted: Orders

## 2015-04-06 NOTE — Progress Notes (Signed)
Patient ID: Benjamin Vega, male   DOB: 17-Jul-1950, 64 y.o.   MRN: [id number]     Cardiology Office Note   Date:  04/06/2015   ID:  Benjamin Vega, DOB 09-16-1950, MRN [id number]  PCP:  Reginia Naas, MD    Chief Complaint  Patient presents with  . Follow-up    ANNUAL     Wt Readings from Last 3 Encounters:  04/06/15 209 lb 3.2 oz (94.892 kg)  07/14/14 213 lb (96.616 kg)  07/11/14 213 lb (96.616 kg)       History of Present Illness: Benjamin Vega is a 64 y.o. male  who has needed multiple medications to control BP. Some fatigue in the past, but better on less coreg. Hypertension: rare edema in the legs. No facial swelling.  c/o Chest pain Only with over eating. No relation to exertion.Benjamin Vega daily, more on weekends. Denies : Dizziness.  .  Orthopnea.  Paroxysmal nocturnal dyspnea.  Palpitations.  Dyspnea.  Syncope.   Tolerating meds well.  Home BPs well controlled,     Past Medical History  Diagnosis Date  . Erectile dysfunction   . Edema   . HTN (hypertension)   . Hypercholesteremia   . Hyperlipemia     Past Surgical History  Procedure Laterality Date  . Colonoscopy       Current Outpatient Prescriptions  Medication Sig Dispense Refill  . amlodipine-atorvastatin (CADUET) 10-10 MG per tablet Take 1 tablet by mouth daily. 90 tablet 0  . aspirin 325 MG tablet Take 325 mg by mouth daily.    . carvedilol (COREG) 6.25 MG tablet TAKE 1 TABLET TWICE A DAY 180 tablet 2  . CIALIS 20 MG tablet TAKE 1 TABLET BY MOUTH DAILY AS NEEDED FOR ERECTILE DYSFUNCTION. 10 tablet 3  . COMBIGAN 0.2-0.5 % ophthalmic solution Place 1 drop into both eyes every 12 (twelve) hours.     Marland Kitchen olmesartan (BENICAR) 40 MG tablet Take 1 tablet (40 mg total) by mouth daily. 90 tablet 0  . spironolactone (ALDACTONE) 25 MG tablet Take 1 tablet (25 mg total) by mouth daily. 90 tablet 0   No current facility-administered medications for this visit.    Allergies:   Review of  patient's allergies indicates no known allergies.    Social History:  The patient  reports that he has quit smoking. He has quit using smokeless tobacco. His smokeless tobacco use included Chew. He reports that he drinks about 2.4 oz of alcohol per week.   Family History:  The patient's *family history includes Diabetes in his mother; Esophageal cancer in his sister; Heart disease in his father; Hypertension in his father.    ROS:  Please see the history of present illness.   Otherwise, review of systems are positive for difficulty losing weight.   All other systems are reviewed and negative.    PHYSICAL EXAM: VS:  BP 144/98 mmHg  Pulse 65  Ht 5\' 7"  (1.702 m)  Wt 209 lb 3.2 oz (94.892 kg)  BMI 32.76 kg/m2 , BMI Body mass index is 32.76 kg/(m^2). GEN: Well nourished, well developed, in no acute distress HEENT: normal Neck: no JVD, carotid bruits, or masses Cardiac: RRR; no murmurs, rubs, or gallops,no edema  Respiratory:  clear to auscultation bilaterally, normal work of breathing GI: soft, nontender, nondistended, + BS, small umbilical hernia MS: no deformity or atrophy Skin: warm and dry, no rash Neuro:  Strength and sensation are intact Psych: euthymic mood, full affect   EKG:  The ekg ordered today demonstrates normal    Recent Labs: No results found for requested labs within last 365 days.   Lipid Panel No results found for: CHOL, TRIG, HDL, CHOLHDL, VLDL, LDLCALC, LDLDIRECT   Other studies Reviewed: Additional studies/ records that were reviewed today with results demonstrating: Home blood pressure readings personally reviewed. Systolics typically in the 100 to 768 range systolic..   ASSESSMENT AND PLAN:  1. Essential HTN: Well controlled at home. Continue current medicines. Will refill his current antihypertensives.  Tolerating his medicines well. No symptoms of angioedema in the past year. 2. Lifeline screening done in 2016. No abdominal aortic aneurysm. Normal  ABIs. Normal carotid flow velocities. 3. Obesity: He is trying to lose weight through diet control and exercise. We talked about the importance of high protein, high fiber and low carbohydrate diet. 4. Check labs when fasting.  Hyperlipidemia well controlled in the past.   Current medicines are reviewed at length with the patient today.  The patient concerns regarding his medicines were addressed.  The following changes have been made:  No change  Labs/ tests ordered today include:  No orders of the defined types were placed in this encounter.    Recommend 150 minutes/week of aerobic exercise Low fat, low carb, high fiber diet recommended  Disposition:   FU in 1 year   Teresita Madura., MD  04/06/2015 2:34 PM    [facility] [place], [place], [place]  [address] Phone: [phone]; Fax: [phone]

## 2015-04-06 NOTE — Patient Instructions (Addendum)
Medication Instructions:  Same-no changes  Labwork: CBCD, CMET and Lipids. Please do not eat or drink after midnight the night before labs are drawn.  Testing/Procedures: None  Follow-Up: Your physician wants you to follow-up in: 1 year. You will receive a reminder letter in the mail two months in advance. If you don't receive a letter, please call our office to schedule the follow-up appointment.

## 2015-04-12 ENCOUNTER — Other Ambulatory Visit (INDEPENDENT_AMBULATORY_CARE_PROVIDER_SITE_OTHER): Payer: BLUE CROSS/BLUE SHIELD | Admitting: *Deleted

## 2015-04-12 DIAGNOSIS — Z833 Family history of diabetes mellitus: Secondary | ICD-10-CM

## 2015-04-12 DIAGNOSIS — I10 Essential (primary) hypertension: Secondary | ICD-10-CM | POA: Diagnosis not present

## 2015-04-12 DIAGNOSIS — E7800 Pure hypercholesterolemia, unspecified: Secondary | ICD-10-CM

## 2015-04-12 DIAGNOSIS — E785 Hyperlipidemia, unspecified: Secondary | ICD-10-CM

## 2015-04-12 LAB — COMPREHENSIVE METABOLIC PANEL
ALT: 21 U/L (ref 9–46)
AST: 21 U/L (ref 10–35)
Albumin: 4.2 g/dL (ref 3.6–5.1)
Alkaline Phosphatase: 50 U/L (ref 40–115)
BILIRUBIN TOTAL: 0.4 mg/dL (ref 0.2–1.2)
BUN: 18 mg/dL (ref 7–25)
CHLORIDE: 104 mmol/L (ref 98–110)
CO2: 27 mmol/L (ref 20–31)
CREATININE: 1.13 mg/dL (ref 0.70–1.25)
Calcium: 9 mg/dL (ref 8.6–10.3)
GLUCOSE: 111 mg/dL — AB (ref 65–99)
Potassium: 4 mmol/L (ref 3.5–5.3)
SODIUM: 139 mmol/L (ref 135–146)
Total Protein: 7.3 g/dL (ref 6.1–8.1)

## 2015-04-12 LAB — CBC WITH DIFFERENTIAL/PLATELET
Basophils Absolute: 0 10*3/uL (ref 0.0–0.1)
Basophils Relative: 1 % (ref 0–1)
EOS ABS: 0.1 10*3/uL (ref 0.0–0.7)
Eosinophils Relative: 2 % (ref 0–5)
HEMATOCRIT: 43.3 % (ref 39.0–52.0)
Hemoglobin: 14.2 g/dL (ref 13.0–17.0)
LYMPHS ABS: 1.3 10*3/uL (ref 0.7–4.0)
LYMPHS PCT: 32 % (ref 12–46)
MCH: 30.1 pg (ref 26.0–34.0)
MCHC: 32.8 g/dL (ref 30.0–36.0)
MCV: 91.7 fL (ref 78.0–100.0)
MONOS PCT: 10 % (ref 3–12)
MPV: 9.6 fL (ref 8.6–12.4)
Monocytes Absolute: 0.4 10*3/uL (ref 0.1–1.0)
NEUTROS PCT: 55 % (ref 43–77)
Neutro Abs: 2.3 10*3/uL (ref 1.7–7.7)
PLATELETS: 208 10*3/uL (ref 150–400)
RBC: 4.72 MIL/uL (ref 4.22–5.81)
RDW: 14.9 % (ref 11.5–15.5)
WBC: 4.1 10*3/uL (ref 4.0–10.5)

## 2015-04-12 LAB — LIPID PANEL
CHOL/HDL RATIO: 2.7 ratio (ref <=5.0)
Cholesterol: 114 mg/dL — ABNORMAL LOW (ref 125–200)
HDL: 43 mg/dL (ref >=40)
LDL CALC: 59 mg/dL (ref <130)
Triglycerides: 62 mg/dL (ref <150)
VLDL: 12 mg/dL (ref <30)

## 2015-04-12 NOTE — Addendum Note (Signed)
Addended by: Eulis Foster on: 04/12/2015 07:38 AM   Modules accepted: Orders

## 2015-04-12 NOTE — Addendum Note (Signed)
Addended by: Eulis Foster on: 04/12/2015 07:37 AM   Modules accepted: Orders

## 2015-07-20 ENCOUNTER — Telehealth: Payer: Self-pay | Admitting: Interventional Cardiology

## 2015-07-20 MED ORDER — TADALAFIL 20 MG PO TABS: ORAL_TABLET | ORAL | Status: DC

## 2015-07-20 MED ORDER — CARVEDILOL 6.25 MG PO TABS: 6.2500 mg | ORAL_TABLET | Freq: Two times a day (BID) | ORAL | Status: DC

## 2015-07-20 MED ORDER — AMLODIPINE-ATORVASTATIN 10-10 MG PO TABS: 1.0000 | ORAL_TABLET | Freq: Every day | ORAL | Status: DC

## 2015-07-20 MED ORDER — OLMESARTAN MEDOXOMIL 40 MG PO TABS: 40.0000 mg | ORAL_TABLET | Freq: Every day | ORAL | Status: DC

## 2015-07-20 MED ORDER — SPIRONOLACTONE 25 MG PO TABS: 25.0000 mg | ORAL_TABLET | Freq: Every day | ORAL | Status: DC

## 2015-07-20 NOTE — Telephone Encounter (Signed)
Will route to Dr. Irish Lack for approval to fill Cialis.

## 2015-07-20 NOTE — Telephone Encounter (Signed)
New message      *STAT* If patient is at the pharmacy, call can be transferred to refill team.   1. Which medications need to be refilled? (please list name of each medication and dose if known) caduet 10-10, carvedilol 6.25, benicar 40mg , spironolactone 25mg , cialis 20mg   2. Which pharmacy/location (including street and city if local pharmacy) is medication to be sent to? [facility]new pharmacy 3. Do they need a 30 day or 90 day supply? 90 day supply

## 2015-07-20 NOTE — Telephone Encounter (Signed)
OK to refill

## 2015-07-20 NOTE — Telephone Encounter (Signed)
Pt's Rx sent to pt's pharmacy as requested. Confirmation received.  °

## 2015-07-20 NOTE — Telephone Encounter (Signed)
Pt is requesting a refill on Cialis 20 mg tablet. Please advise

## 2015-07-26 ENCOUNTER — Telehealth: Payer: Self-pay

## 2015-07-26 NOTE — Telephone Encounter (Signed)
Prior auth for  Cialis 20mg  sent to [facility] .

## 2015-09-05 DIAGNOSIS — H25013 Cortical age-related cataract, bilateral: Secondary | ICD-10-CM | POA: Diagnosis not present

## 2015-09-05 DIAGNOSIS — H2513 Age-related nuclear cataract, bilateral: Secondary | ICD-10-CM | POA: Diagnosis not present

## 2015-09-05 DIAGNOSIS — H18413 Arcus senilis, bilateral: Secondary | ICD-10-CM | POA: Diagnosis not present

## 2015-09-05 DIAGNOSIS — H401121 Primary open-angle glaucoma, left eye, mild stage: Secondary | ICD-10-CM | POA: Diagnosis not present

## 2015-09-05 DIAGNOSIS — H401111 Primary open-angle glaucoma, right eye, mild stage: Secondary | ICD-10-CM | POA: Diagnosis not present

## 2015-10-16 DIAGNOSIS — R198 Other specified symptoms and signs involving the digestive system and abdomen: Secondary | ICD-10-CM | POA: Diagnosis not present

## 2015-10-16 DIAGNOSIS — Z0000 Encounter for general adult medical examination without abnormal findings: Secondary | ICD-10-CM | POA: Diagnosis not present

## 2015-10-16 DIAGNOSIS — I10 Essential (primary) hypertension: Secondary | ICD-10-CM | POA: Diagnosis not present

## 2015-10-16 DIAGNOSIS — Z136 Encounter for screening for cardiovascular disorders: Secondary | ICD-10-CM | POA: Diagnosis not present

## 2015-10-16 DIAGNOSIS — Z1389 Encounter for screening for other disorder: Secondary | ICD-10-CM | POA: Diagnosis not present

## 2015-10-16 DIAGNOSIS — E782 Mixed hyperlipidemia: Secondary | ICD-10-CM | POA: Diagnosis not present

## 2015-10-16 DIAGNOSIS — Z23 Encounter for immunization: Secondary | ICD-10-CM | POA: Diagnosis not present

## 2015-10-17 ENCOUNTER — Other Ambulatory Visit: Payer: Self-pay | Admitting: Physician Assistant

## 2015-10-17 DIAGNOSIS — Z136 Encounter for screening for cardiovascular disorders: Secondary | ICD-10-CM

## 2015-10-17 DIAGNOSIS — Z87891 Personal history of nicotine dependence: Secondary | ICD-10-CM

## 2015-10-24 ENCOUNTER — Ambulatory Visit
Admission: RE | Admit: 2015-10-24 | Discharge: 2015-10-24 | Disposition: A | Discharge status: Home / Self Care | Payer: BLUE CROSS/BLUE SHIELD | Source: Ambulatory Visit | Attending: Physician Assistant | Admitting: Physician Assistant

## 2015-10-24 DIAGNOSIS — Z87891 Personal history of nicotine dependence: Secondary | ICD-10-CM

## 2015-10-24 DIAGNOSIS — Z136 Encounter for screening for cardiovascular disorders: Secondary | ICD-10-CM

## 2016-01-09 DIAGNOSIS — H401121 Primary open-angle glaucoma, left eye, mild stage: Secondary | ICD-10-CM | POA: Diagnosis not present

## 2016-01-09 DIAGNOSIS — H401111 Primary open-angle glaucoma, right eye, mild stage: Secondary | ICD-10-CM | POA: Diagnosis not present

## 2016-01-09 DIAGNOSIS — H04211 Epiphora due to excess lacrimation, right lacrimal gland: Secondary | ICD-10-CM | POA: Diagnosis not present

## 2016-04-14 DIAGNOSIS — Z23 Encounter for immunization: Secondary | ICD-10-CM | POA: Diagnosis not present

## 2016-04-25 ENCOUNTER — Encounter: Payer: Self-pay | Admitting: Interventional Cardiology

## 2016-05-09 NOTE — Progress Notes (Signed)
Patient ID: Benjamin Vega, male   DOB: Aug 30, 1950, 65 y.o.   MRN: [id number]     Cardiology Office Note   Date:  05/10/2016   ID:  Benjamin Vega, DOB Oct 31, 1950, MRN [id number]  PCP:  Reginia Naas, MD    No chief complaint on file. HTN   Wt Readings from Last 3 Encounters:  05/10/16 95.8 kg (211 lb 3.2 oz)  04/06/15 94.9 kg (209 lb 3.2 oz)  07/14/14 96.6 kg (213 lb)       History of Present Illness: Benjamin Vega is a 65 y.o. male  who has needed multiple medications to control BP. Some fatigue in the past, but better on less Coreg. Hypertension: rare edema in the legs. No facial swelling.  No Chest pain.  Walks daily, more on weekends. Denies : Dizziness.  Orthopnea. Paroxysmal nocturnal dyspnea. Palpitations.  Dyspnea.  Syncope.   Tolerating meds well.  Home BPs well controlled, checking at [facility].  Uses a stepper daily.  No edema.  He has received the shingles shot and pneumovax.    Past Medical History:  Diagnosis Date  . Edema   . Erectile dysfunction   . HTN (hypertension)   . Hypercholesteremia   . Hyperlipemia     Past Surgical History:  Procedure Laterality Date  . COLONOSCOPY       Current Outpatient Prescriptions  Medication Sig Dispense Refill  . amlodipine-atorvastatin (CADUET) 10-10 MG tablet Take 1 tablet by mouth daily. 90 tablet 3  . aspirin 325 MG tablet Take 325 mg by mouth daily.    . carvedilol (COREG) 6.25 MG tablet Take 1 tablet (6.25 mg total) by mouth 2 (two) times daily. 180 tablet 3  . COMBIGAN 0.2-0.5 % ophthalmic solution Place 1 drop into both eyes every 12 (twelve) hours.     Marland Kitchen olmesartan (BENICAR) 40 MG tablet Take 1 tablet (40 mg total) by mouth daily. 90 tablet 3  . spironolactone (ALDACTONE) 25 MG tablet Take 1 tablet (25 mg total) by mouth daily. 90 tablet 3   No current facility-administered medications for this visit.     Allergies:   Patient has no known allergies.    Social History:  The patient   reports that he has quit smoking. He has quit using smokeless tobacco. His smokeless tobacco use included Chew. He reports that he drinks about 2.4 oz of alcohol per week . He reports that he does not use drugs.   Family History:  The patient's *family history includes Diabetes in his mother; Esophageal cancer in his sister; Heart disease in his father; Hypertension in his father.    ROS:  Please see the history of present illness.   Otherwise, review of systems are positive for difficulty losing weight.   All other systems are reviewed and negative.    PHYSICAL EXAM: VS:  BP (!) 150/90   Pulse 76   Ht 5\' 6"  (1.676 m)   Wt 95.8 kg (211 lb 3.2 oz)   BMI 34.09 kg/m  , BMI Body mass index is 34.09 kg/m. GEN: Well nourished, well developed, in no acute distress  HEENT: normal  Neck: no JVD, carotid bruits, or masses Cardiac: RRR; no murmurs, rubs, or gallops,no edema  Respiratory:  clear to auscultation bilaterally, normal work of breathing GI: soft, nontender, nondistended, + BS, small umbilical hernia MS: no deformity or atrophy  Skin: warm and dry, no rash Neuro:  Strength and sensation are intact Psych: euthymic mood, full affect  EKG:   The ekg ordered today demonstrates normal    Recent Labs: No results found for requested labs within last 8760 hours.   Lipid Panel    Component Value Date/Time   CHOL 114 (L) 04/12/2015 0738   TRIG 62 04/12/2015 0738   HDL 43 04/12/2015 0738   CHOLHDL 2.7 04/12/2015 0738   VLDL 12 04/12/2015 0738   LDLCALC 59 04/12/2015 0738     Other studies Reviewed: Additional studies/ records that were reviewed today with results demonstrating: Home blood pressure readings personally reviewed. Systolics typically in the 100 to [id number] range systolic..   ASSESSMENT AND PLAN:  1. Essential HTN: Well controlled at home or [facility].  He will be getting a new cuff. Continue current medicines. Will refill his current antihypertensives.  Tolerating his  medicines well. No symptoms of angioedema in the past year. 2. Lifeline screening done in 2016. No abdominal aortic aneurysm. Normal ABIs. Normal carotid flow velocities. 3. Obesity: He is trying to lose weight through diet control and exercise. We talked about the importance of high protein, high fiber and low carbohydrate diet. 4. Check labs when fasting.  Hyperlipidemia well controlled in the past.  He wants new Rx written.  He is changing pharmacies.   5. Erectile dysfunction:  He had success with Cialis in the past but now    Current medicines are reviewed at length with the patient today.  The patient concerns regarding his medicines were addressed.  The following changes have been made:  No change  Labs/ tests ordered today include:  No orders of the defined types were placed in this encounter.   Recommend 150 minutes/week of aerobic exercise Low fat, low carb, high fiber diet recommended  Disposition:   FU in 1 year   Signed, Larae Grooms, MD  05/10/2016 8:14 AM    [facility] [place], [place],   [address] Phone: [phone]; Fax: [phone]

## 2016-05-10 ENCOUNTER — Encounter: Payer: Self-pay | Admitting: Interventional Cardiology

## 2016-05-10 ENCOUNTER — Ambulatory Visit (INDEPENDENT_AMBULATORY_CARE_PROVIDER_SITE_OTHER): Payer: Medicare Other | Admitting: Interventional Cardiology

## 2016-05-10 ENCOUNTER — Encounter (INDEPENDENT_AMBULATORY_CARE_PROVIDER_SITE_OTHER): Payer: Self-pay

## 2016-05-10 VITALS — BP 150/90 | HR 76 | Ht 66.0 in | Wt 211.2 lb

## 2016-05-10 DIAGNOSIS — E7800 Pure hypercholesterolemia, unspecified: Secondary | ICD-10-CM

## 2016-05-10 DIAGNOSIS — N529 Male erectile dysfunction, unspecified: Secondary | ICD-10-CM | POA: Diagnosis not present

## 2016-05-10 DIAGNOSIS — E782 Mixed hyperlipidemia: Secondary | ICD-10-CM | POA: Diagnosis not present

## 2016-05-10 LAB — COMPREHENSIVE METABOLIC PANEL
ALBUMIN: 4.5 g/dL (ref 3.6–5.1)
ALK PHOS: 54 U/L (ref 40–115)
ALT: 17 U/L (ref 9–46)
AST: 20 U/L (ref 10–35)
BILIRUBIN TOTAL: 0.5 mg/dL (ref 0.2–1.2)
BUN: 15 mg/dL (ref 7–25)
CO2: 29 mmol/L (ref 20–31)
CREATININE: 1.31 mg/dL — AB (ref 0.70–1.25)
Calcium: 9.6 mg/dL (ref 8.6–10.3)
Chloride: 101 mmol/L (ref 98–110)
Glucose, Bld: 107 mg/dL — ABNORMAL HIGH (ref 65–99)
Potassium: 4.3 mmol/L (ref 3.5–5.3)
SODIUM: 138 mmol/L (ref 135–146)
TOTAL PROTEIN: 7.4 g/dL (ref 6.1–8.1)

## 2016-05-10 LAB — LIPID PANEL
CHOL/HDL RATIO: 2.9 ratio (ref <5.0)
CHOLESTEROL: 129 mg/dL (ref <200)
HDL: 45 mg/dL (ref >40)
LDL Cholesterol: 66 mg/dL (ref <100)
TRIGLYCERIDES: 88 mg/dL (ref <150)
VLDL: 18 mg/dL (ref <30)

## 2016-05-10 MED ORDER — SPIRONOLACTONE 25 MG PO TABS
25.0000 mg | ORAL_TABLET | Freq: Every day | ORAL | 3 refills | Status: DC
Start: 2016-05-10 — End: 2016-06-27

## 2016-05-10 MED ORDER — CARVEDILOL 6.25 MG PO TABS: 6.2500 mg | ORAL_TABLET | Freq: Two times a day (BID) | ORAL | 3 refills | Status: DC

## 2016-05-10 MED ORDER — ATORVASTATIN CALCIUM 10 MG PO TABS: 10.0000 mg | ORAL_TABLET | Freq: Every day | ORAL | 3 refills | Status: DC

## 2016-05-10 MED ORDER — AMLODIPINE BESYLATE 10 MG PO TABS: 10.0000 mg | ORAL_TABLET | Freq: Every day | ORAL | 3 refills | Status: DC

## 2016-05-10 MED ORDER — OLMESARTAN MEDOXOMIL 40 MG PO TABS: 40.0000 mg | ORAL_TABLET | Freq: Every day | ORAL | 3 refills | Status: DC

## 2016-05-10 NOTE — Patient Instructions (Addendum)
Medication Instructions:  Stop taking Amlodipine/Atorvasatin and take Amlodipine 10 mg daily and Atorvastatin 10 mg daily. All other medications remain the same.  Labwork: Lipids and CMET today  Testing/Procedures: None  Follow-Up: Your physician wants you to follow-up in: 1 year. You will receive a reminder letter in the mail two months in advance. If you don't receive a letter, please call our office to schedule the follow-up appointment.     If you need a refill on your cardiac medications before your next appointment, please call your pharmacy.

## 2016-06-27 ENCOUNTER — Other Ambulatory Visit: Payer: Self-pay | Admitting: *Deleted

## 2016-06-27 ENCOUNTER — Telehealth: Payer: Self-pay | Admitting: Interventional Cardiology

## 2016-06-27 MED ORDER — AMLODIPINE BESYLATE 10 MG PO TABS: 10.0000 mg | ORAL_TABLET | Freq: Every day | ORAL | 3 refills | Status: DC

## 2016-06-27 MED ORDER — OLMESARTAN MEDOXOMIL 40 MG PO TABS: 40.0000 mg | ORAL_TABLET | Freq: Every day | ORAL | 3 refills | Status: DC

## 2016-06-27 MED ORDER — ATORVASTATIN CALCIUM 10 MG PO TABS: 10.0000 mg | ORAL_TABLET | Freq: Every day | ORAL | 3 refills | Status: DC

## 2016-06-27 MED ORDER — SPIRONOLACTONE 25 MG PO TABS: 25.0000 mg | ORAL_TABLET | Freq: Every day | ORAL | 3 refills | Status: DC

## 2016-06-27 MED ORDER — CARVEDILOL 6.25 MG PO TABS: 6.2500 mg | ORAL_TABLET | Freq: Two times a day (BID) | ORAL | 3 refills | Status: DC

## 2016-06-27 NOTE — Telephone Encounter (Signed)
New message   *STAT* If patient is at the pharmacy, call can be transferred to refill team.   1. Which medications need to be refilled? (please list name of each medication and dose if known) benicar 40mg   2. Which pharmacy/location (including street and city if local pharmacy) is medication to be sent to? [facility] [place]  3. Do they need a 30 day or 90 day supply? [place]

## 2016-07-01 ENCOUNTER — Telehealth: Payer: Self-pay | Admitting: Interventional Cardiology

## 2016-07-01 MED ORDER — OLMESARTAN MEDOXOMIL 40 MG PO TABS: 40.0000 mg | ORAL_TABLET | Freq: Every day | ORAL | 3 refills | Status: DC

## 2016-07-01 NOTE — Telephone Encounter (Signed)
°*  STAT* If patient is at the pharmacy, call can be transferred to refill team.   1. Which medications need to be refilled? (please list name of each medication and dose if known) Olmesartan 40 mg   2. Which pharmacy/location (including street and city if local pharmacy) is medication to be sent to?[place]   3. Do they need a 30 day or 90 day supply? [place]

## 2016-07-01 NOTE — Telephone Encounter (Signed)
Rx sent to [facility] as requested.  

## 2016-07-02 ENCOUNTER — Other Ambulatory Visit: Payer: Self-pay | Admitting: *Deleted

## 2016-07-02 MED ORDER — SPIRONOLACTONE 25 MG PO TABS: 25.0000 mg | ORAL_TABLET | Freq: Every day | ORAL | 3 refills | Status: DC

## 2016-07-18 DIAGNOSIS — H401131 Primary open-angle glaucoma, bilateral, mild stage: Secondary | ICD-10-CM | POA: Diagnosis not present

## 2016-07-18 DIAGNOSIS — H2513 Age-related nuclear cataract, bilateral: Secondary | ICD-10-CM | POA: Diagnosis not present

## 2016-07-18 DIAGNOSIS — H35033 Hypertensive retinopathy, bilateral: Secondary | ICD-10-CM | POA: Diagnosis not present

## 2016-07-18 DIAGNOSIS — H25013 Cortical age-related cataract, bilateral: Secondary | ICD-10-CM | POA: Diagnosis not present

## 2017-01-09 DIAGNOSIS — H401131 Primary open-angle glaucoma, bilateral, mild stage: Secondary | ICD-10-CM | POA: Diagnosis not present

## 2017-04-09 DIAGNOSIS — Z23 Encounter for immunization: Secondary | ICD-10-CM | POA: Diagnosis not present

## 2017-04-14 ENCOUNTER — Other Ambulatory Visit: Payer: Self-pay | Admitting: Interventional Cardiology

## 2017-05-09 ENCOUNTER — Other Ambulatory Visit: Payer: Self-pay | Admitting: Interventional Cardiology

## 2017-05-09 ENCOUNTER — Other Ambulatory Visit: Payer: Self-pay

## 2017-05-09 MED ORDER — ATORVASTATIN CALCIUM 10 MG PO TABS: 10.0000 mg | ORAL_TABLET | Freq: Every day | ORAL | 0 refills | Status: DC

## 2017-05-09 MED ORDER — OLMESARTAN MEDOXOMIL 40 MG PO TABS: 40.0000 mg | ORAL_TABLET | Freq: Every day | ORAL | 0 refills | Status: DC

## 2017-05-09 MED ORDER — CARVEDILOL 6.25 MG PO TABS: 6.2500 mg | ORAL_TABLET | Freq: Two times a day (BID) | ORAL | 0 refills | Status: DC

## 2017-05-09 NOTE — Addendum Note (Signed)
Addended by: Joaquim Lai on: 05/09/2017 04:40 PM   Modules accepted: Orders

## 2017-06-03 ENCOUNTER — Other Ambulatory Visit: Payer: Self-pay | Admitting: Interventional Cardiology

## 2017-06-04 ENCOUNTER — Other Ambulatory Visit: Payer: Self-pay | Admitting: Interventional Cardiology

## 2017-06-30 NOTE — Progress Notes (Signed)
Cardiology Office Note   Date:  07/01/2017   ID:  Benjamin Vega, DOB 10-26-1950, MRN [id number]  PCP:  Carol Ada, MD    No chief complaint on file. HTN   Wt Readings from Last 3 Encounters:  07/01/17 214 lb 9.6 oz (97.3 kg)  05/10/16 211 lb 3.2 oz (95.8 kg)  04/06/15 209 lb 3.2 oz (94.9 kg)       History of Present Illness: Benjamin Vega is a 67 y.o. male  who has needed multiple medications to control BP. Some fatigue in the past, but better on less Coreg.  Lifeline screening done in 2016. No abdominal aortic aneurysm. Normal ABIs. Normal carotid flow velocities. Repeat in 2017 was also normal.  Denies : Chest pain. Dizziness. Leg edema. Nitroglycerin use. Orthopnea. Palpitations. Paroxysmal nocturnal dyspnea. Shortness of breath. Syncope.   Walking limited by hip pain.  He had to decrease from 5 miles down to 2 miles.  He uses some Aleve.  Still has erectile dysfunction.     Past Medical History:  Diagnosis Date  . Edema   . Erectile dysfunction   . HTN (hypertension)   . Hypercholesteremia   . Hyperlipemia     Past Surgical History:  Procedure Laterality Date  . COLONOSCOPY       Current Outpatient Medications  Medication Sig Dispense Refill  . aspirin 325 MG tablet Take 325 mg by mouth daily.    Marland Kitchen atorvastatin (LIPITOR) 10 MG tablet TAKE 1 TABLET EVERY DAY (KEEP UPCOMING APPT FOR MORE REFILLS) 30 tablet 1  . carvedilol (COREG) 6.25 MG tablet Take 1 tablet (6.25 mg total) by mouth 2 (two) times daily. Please keep upcoming appt in January before anymore refills. Thank you 60 tablet 0  . COMBIGAN 0.2-0.5 % ophthalmic solution Place 1 drop into both eyes every 12 (twelve) hours.     Marland Kitchen olmesartan (BENICAR) 40 MG tablet Take 1 tablet (40 mg total) by mouth daily. Please keep upcoming appt before anymore refills. Thank you 30 tablet 0  . spironolactone (ALDACTONE) 25 MG tablet Take 1 tablet (25 mg total) by mouth daily. Please keep upcoming appt in  January before anymore refills. Thank you 30 tablet 0  . amLODipine (NORVASC) 10 MG tablet Take 1 tablet (10 mg total) by mouth daily. 180 tablet 3   No current facility-administered medications for this visit.     Allergies:   Patient has no known allergies.    Social History:  The patient  reports that he has quit smoking. He has quit using smokeless tobacco. His smokeless tobacco use included chew. He reports that he drinks about 2.4 oz of alcohol per week. He reports that he does not use drugs.   Family History:  The patient's family history includes Diabetes in his mother; Esophageal cancer in his sister; Heart disease in his father; Hypertension in his father.    ROS:  Please see the history of present illness.   Otherwise, review of systems are positive for hip pain.   All other systems are reviewed and negative.    PHYSICAL EXAM: VS:  BP 124/78   Pulse 78   Ht 5\' 6"  (1.676 m)   Wt 214 lb 9.6 oz (97.3 kg)   SpO2 96%   BMI 34.64 kg/m  , BMI Body mass index is 34.64 kg/m. GEN: Well nourished, well developed, in no acute distress  HEENT: normal  Neck: no JVD, carotid bruits, or masses Cardiac: RRR; no murmurs, rubs,  or gallops,no edema  Respiratory:  clear to auscultation bilaterally, normal work of breathing GI: soft, nontender, nondistended, + BS, obese, umbilical hernia MS: no deformity or atrophy  Skin: warm and dry, no rash Neuro:  Strength and sensation are intact Psych: euthymic mood, full affect   EKG:   The ekg ordered today demonstrates normal sinus rhythm, no ST changes   Recent Labs: No results found for requested labs within last 8760 hours.   Lipid Panel    Component Value Date/Time   CHOL 129 05/10/2016 0845   TRIG 88 05/10/2016 0845   HDL 45 05/10/2016 0845   CHOLHDL 2.9 05/10/2016 0845   VLDL 18 05/10/2016 0845   LDLCALC 66 05/10/2016 0845     Other studies Reviewed: Additional studies/ records that were reviewed today with results  demonstrating: LDL 66 in 2017.   ASSESSMENT AND PLAN:  1. HTN: Home BP readings controlled.  Tolerating current meds well.  I did ask him to stop Aleve and switch to Tylenol for pain relief.  Will check labs when fasting.  2. Obesity: He has gained some weight.  Hip pain has limited him.  He has a stepper, that is easier on the hips.  3. Erectile dysfunction:  Insurance not covering Cialis after he retired.  He did request that we call in a prescription for Cialis to his mail order pharmacy. 4. Decrease aspirin to 81 mg daily. 5. Umbilical hernia: He declined seeing a surgeon since it does not bother him.  6. Hyperlipidemia: COntinue atorvastatin.  Check lipids.   Current medicines are reviewed at length with the patient today.  The patient concerns regarding his medicines were addressed.  The following changes have been made:  No change  Labs/ tests ordered today include:  No orders of the defined types were placed in this encounter.   Recommend 150 minutes/week of aerobic exercise Low fat, low carb, high fiber diet recommended  Disposition:   FU in 1 year   Signed, Larae Grooms, MD  07/01/2017 7:56 AM    [facility] [place], [place], [place]  [address] Phone: [phone]; Fax: [phone]

## 2017-07-01 ENCOUNTER — Ambulatory Visit (INDEPENDENT_AMBULATORY_CARE_PROVIDER_SITE_OTHER): Payer: Medicare Other | Admitting: Interventional Cardiology

## 2017-07-01 ENCOUNTER — Encounter (INDEPENDENT_AMBULATORY_CARE_PROVIDER_SITE_OTHER): Payer: Self-pay

## 2017-07-01 ENCOUNTER — Encounter: Payer: Self-pay | Admitting: Interventional Cardiology

## 2017-07-01 VITALS — BP 124/78 | HR 78 | Ht 66.0 in | Wt 214.6 lb

## 2017-07-01 DIAGNOSIS — E782 Mixed hyperlipidemia: Secondary | ICD-10-CM | POA: Diagnosis not present

## 2017-07-01 DIAGNOSIS — N529 Male erectile dysfunction, unspecified: Secondary | ICD-10-CM | POA: Diagnosis not present

## 2017-07-01 DIAGNOSIS — K429 Umbilical hernia without obstruction or gangrene: Secondary | ICD-10-CM

## 2017-07-01 DIAGNOSIS — E669 Obesity, unspecified: Secondary | ICD-10-CM

## 2017-07-01 DIAGNOSIS — I10 Essential (primary) hypertension: Secondary | ICD-10-CM | POA: Diagnosis not present

## 2017-07-01 MED ORDER — ASPIRIN EC 81 MG PO TBEC: 81.0000 mg | DELAYED_RELEASE_TABLET | Freq: Every day | ORAL | 3 refills | Status: DC

## 2017-07-01 MED ORDER — OLMESARTAN MEDOXOMIL 40 MG PO TABS: 40.0000 mg | ORAL_TABLET | Freq: Every day | ORAL | 3 refills | Status: DC

## 2017-07-01 MED ORDER — AMLODIPINE BESYLATE 10 MG PO TABS: 10.0000 mg | ORAL_TABLET | Freq: Every day | ORAL | 3 refills | Status: DC

## 2017-07-01 MED ORDER — ATORVASTATIN CALCIUM 10 MG PO TABS: ORAL_TABLET | ORAL | 3 refills | Status: DC

## 2017-07-01 MED ORDER — SPIRONOLACTONE 25 MG PO TABS: 25.0000 mg | ORAL_TABLET | Freq: Every day | ORAL | 3 refills | Status: DC

## 2017-07-01 MED ORDER — CARVEDILOL 6.25 MG PO TABS: 6.2500 mg | ORAL_TABLET | Freq: Two times a day (BID) | ORAL | 3 refills | Status: DC

## 2017-07-01 MED ORDER — TADALAFIL 20 MG PO TABS: 20.0000 mg | ORAL_TABLET | Freq: Every day | ORAL | 0 refills | Status: DC | PRN

## 2017-07-01 NOTE — Patient Instructions (Signed)
Medication Instructions:  Your physician has recommended you make the following change in your medication:   1. DECREASE: Aspirin to 81 mg daily  2. You may take Tylenol. Avoid taking Aleve  Labwork: Your physician recommends that you return for FASTING lab work: CMET, LIPIDS  Testing/Procedures: None ordered  Follow-Up: Your physician wants you to follow-up in: 1 year with Dr. Irish Lack. You will receive a reminder letter in the mail two months in advance. If you don't receive a letter, please call our office to schedule the follow-up appointment.   Any Other Special Instructions Will Be Listed Below (If Applicable).     If you need a refill on your cardiac medications before your next appointment, please call your pharmacy.

## 2017-07-02 ENCOUNTER — Other Ambulatory Visit: Payer: Medicare Other | Admitting: *Deleted

## 2017-07-02 DIAGNOSIS — I10 Essential (primary) hypertension: Secondary | ICD-10-CM

## 2017-07-02 DIAGNOSIS — E782 Mixed hyperlipidemia: Secondary | ICD-10-CM | POA: Diagnosis not present

## 2017-07-02 LAB — COMPREHENSIVE METABOLIC PANEL
ALT: 21 IU/L (ref 0–44)
AST: 21 IU/L (ref 0–40)
Albumin/Globulin Ratio: 1.7 (ref 1.2–2.2)
Albumin: 4.5 g/dL (ref 3.6–4.8)
Alkaline Phosphatase: 67 IU/L (ref 39–117)
BUN/Creatinine Ratio: 16 (ref 10–24)
BUN: 20 mg/dL (ref 8–27)
Bilirubin Total: 0.2 mg/dL (ref 0.0–1.2)
CALCIUM: 9.7 mg/dL (ref 8.6–10.2)
CO2: 24 mmol/L (ref 20–29)
CREATININE: 1.26 mg/dL (ref 0.76–1.27)
Chloride: 101 mmol/L (ref 96–106)
GFR, EST AFRICAN AMERICAN: 67 mL/min/{1.73_m2} (ref >59)
GFR, EST NON AFRICAN AMERICAN: 58 mL/min/{1.73_m2} — AB (ref >59)
GLOBULIN, TOTAL: 2.7 g/dL (ref 1.5–4.5)
Glucose: 114 mg/dL — ABNORMAL HIGH (ref 65–99)
Potassium: 4.4 mmol/L (ref 3.5–5.2)
SODIUM: 139 mmol/L (ref 134–144)
TOTAL PROTEIN: 7.2 g/dL (ref 6.0–8.5)

## 2017-07-02 LAB — LIPID PANEL
CHOLESTEROL TOTAL: 129 mg/dL (ref 100–199)
Chol/HDL Ratio: 2.9 ratio (ref 0.0–5.0)
HDL: 44 mg/dL (ref >39)
LDL CALC: 72 mg/dL (ref 0–99)
TRIGLYCERIDES: 63 mg/dL (ref 0–149)
VLDL Cholesterol Cal: 13 mg/dL (ref 5–40)

## 2017-07-05 IMAGING — US US AORTA SCREENING (MEDICARE)
1 series · 14 of 16 positions shown · non-contrast
Comparison: None ; correlation CT angio abdomen 08/20/2007

CLINICAL DATA: Medicare screening exam for abdominal aortic
aneurysm, male 64 years old, former smoker, history hypertension,
hyperlipidemia, hypercholesterolemia.

EXAM:
ABDOMINAL AORTA SCREENING ULTRASOUND
TECHNIQUE: Ultrasound examination of the abdominal aorta was performed as a
screening evaluation for abdominal aortic aneurysm.

[Series 1: us aorta screening (medicare) · 0.32mm/px · 14 of 16 slices shown]
[im 1/16]
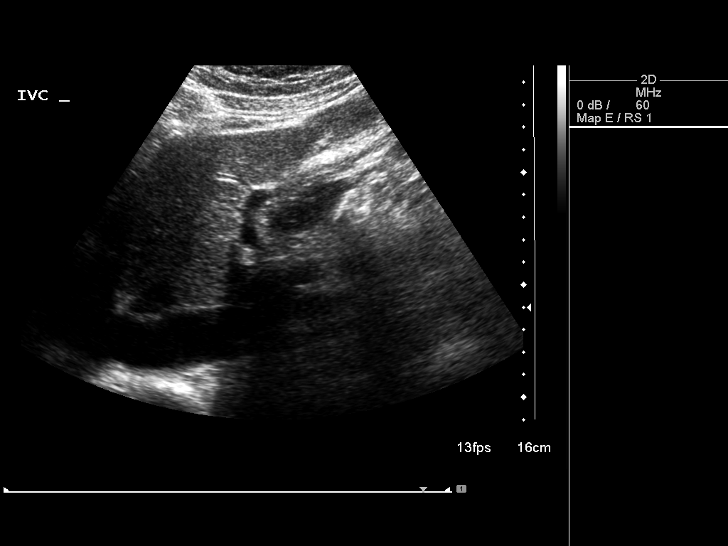
[im 2/16]
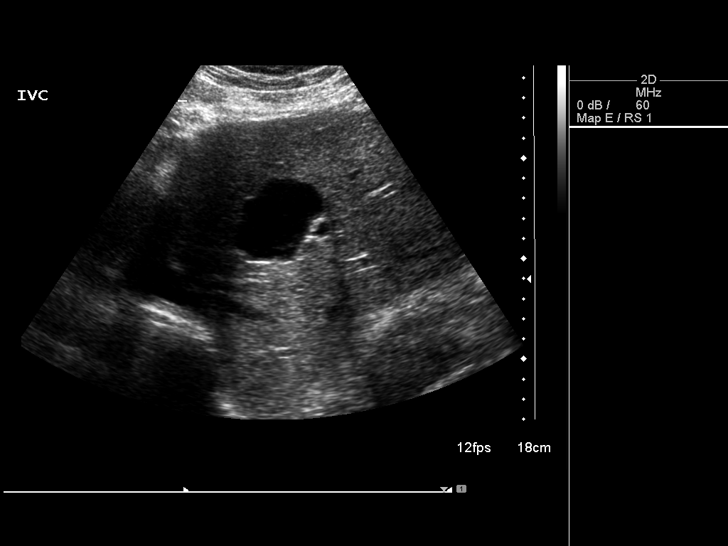
[im 3/16]
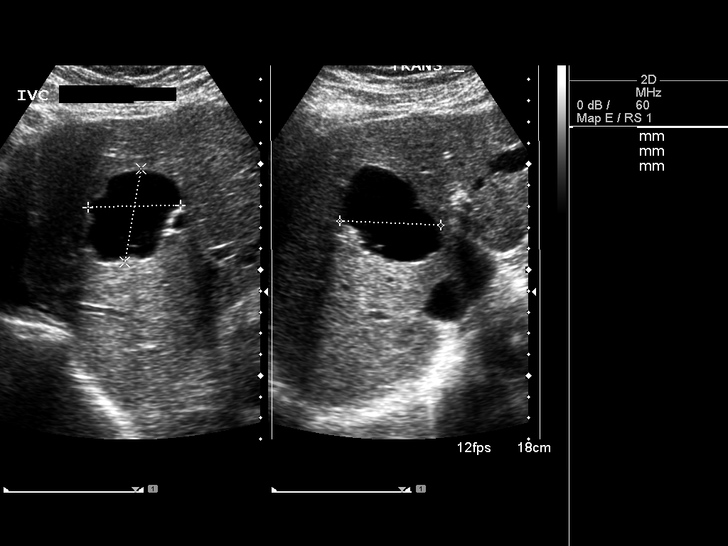
[im 5/16]
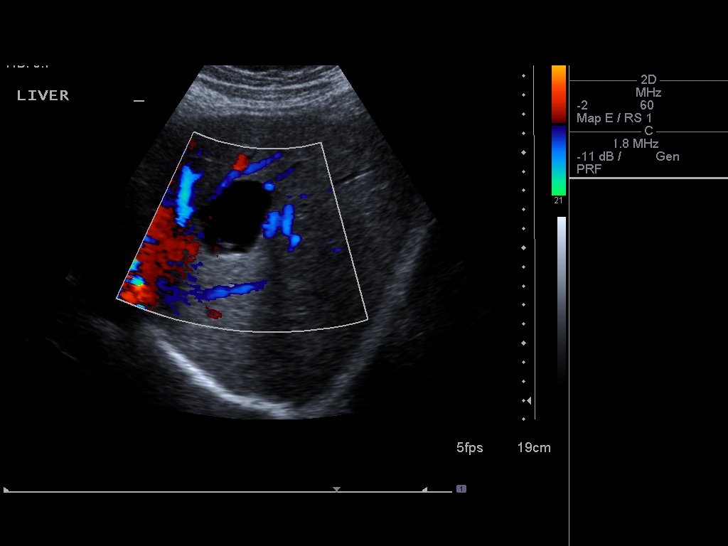
[im 6/16]
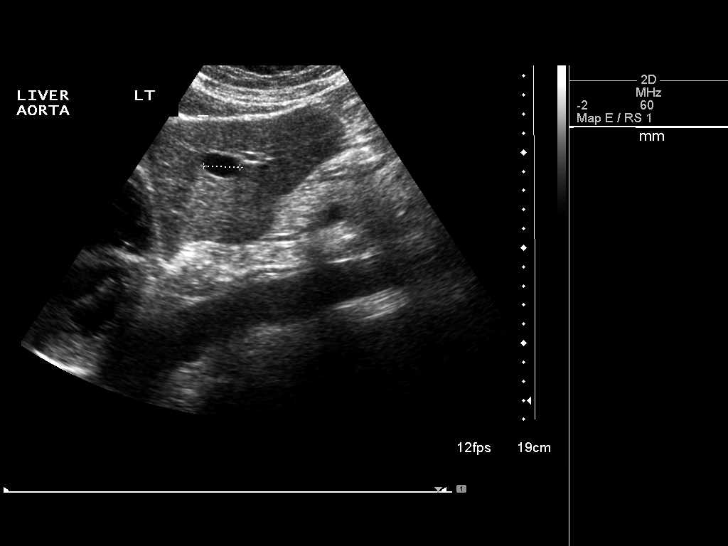
[im 7/16]
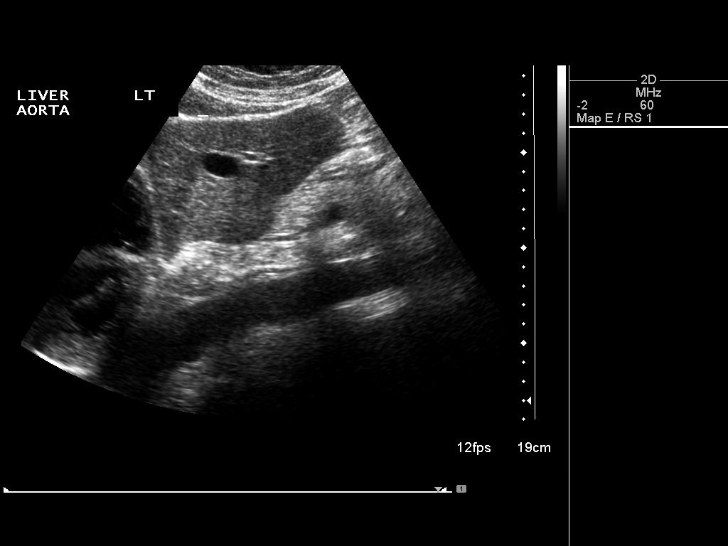
[im 8/16]
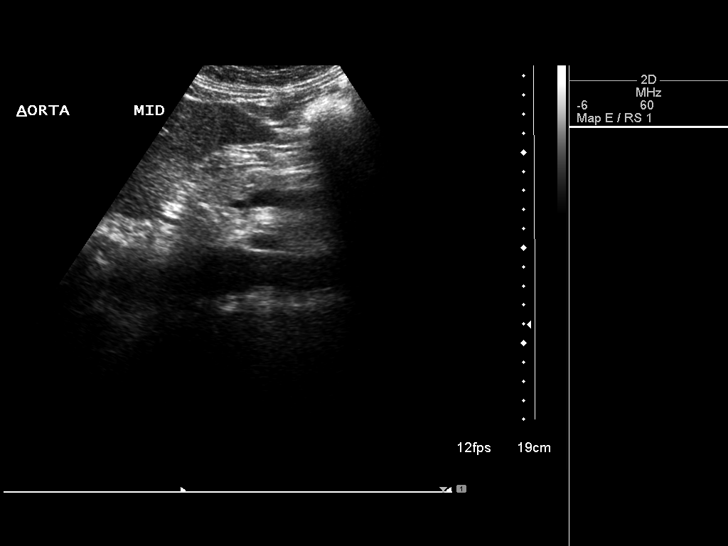
[im 9/16]
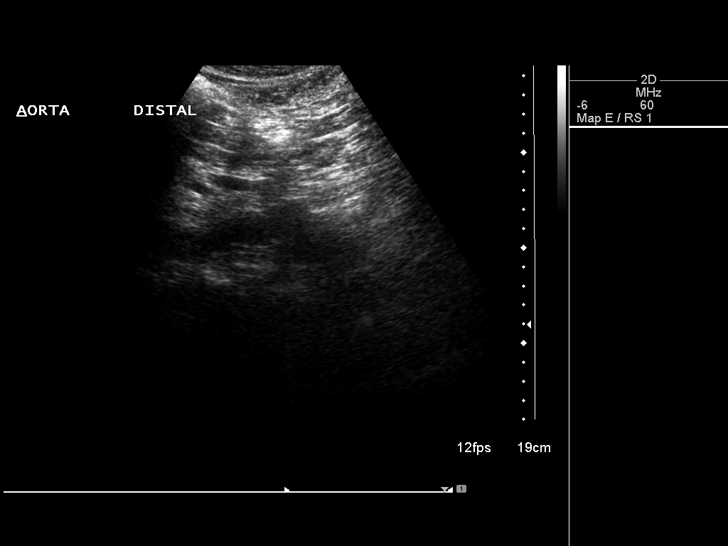
[im 10/16]
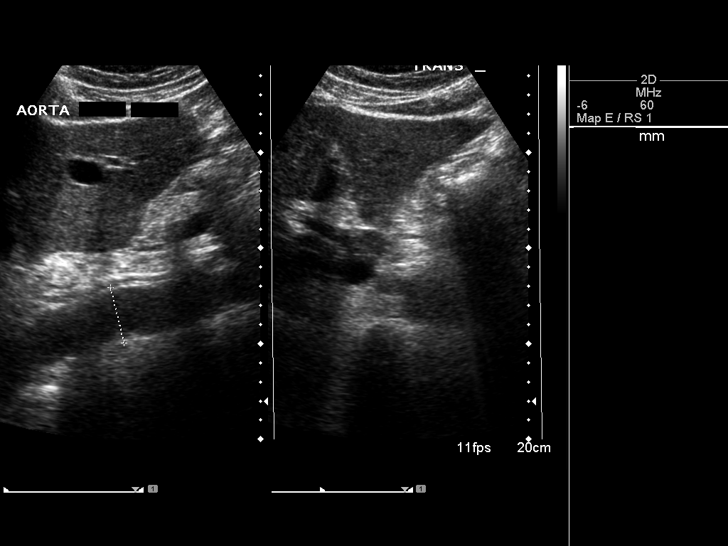
[im 11/16]
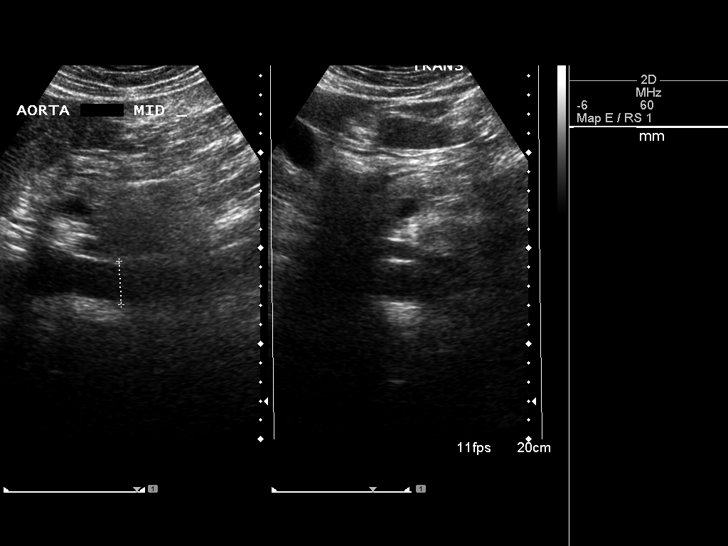
[im 13/16]
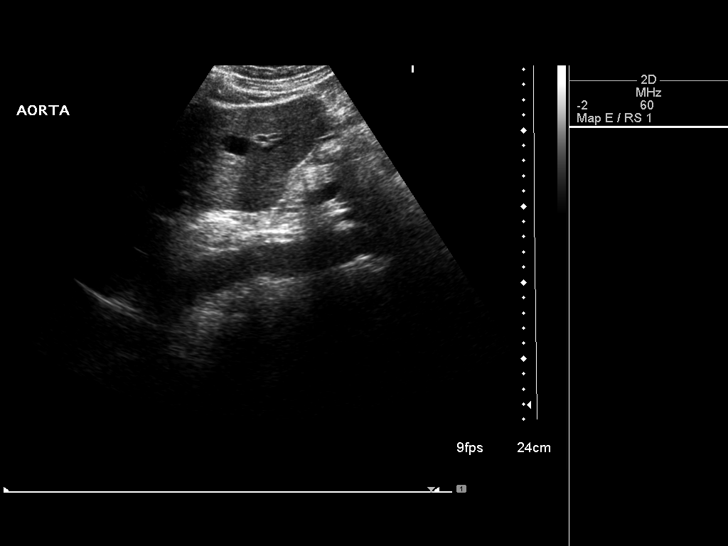
[im 14/16]
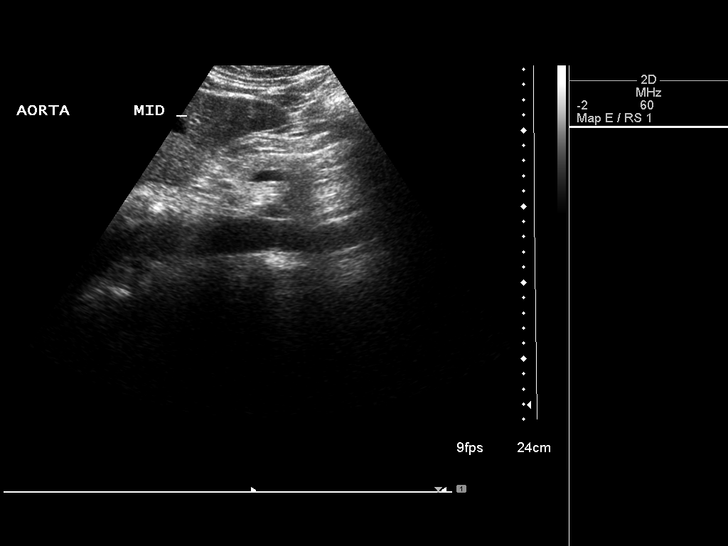
[im 15/16]
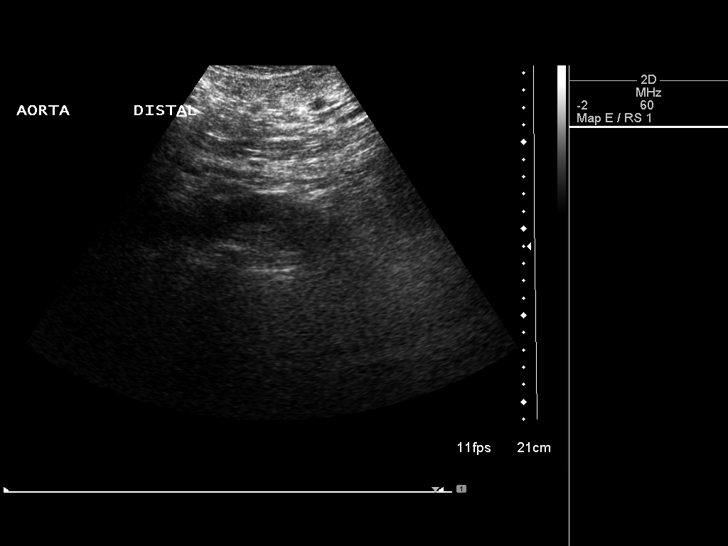
[im 16/16]
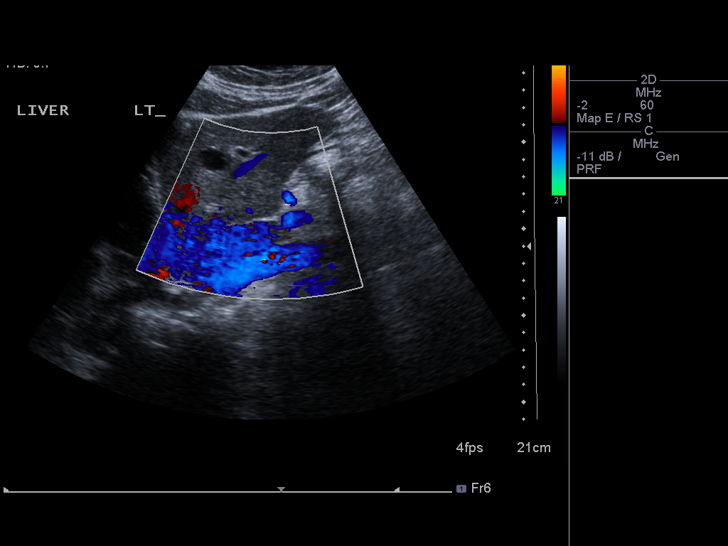

[14 of 16 positions shown; findings below may reference images not displayed]

FINDINGS: Abdominal Aorta

Aorta appears normal caliber without aneurysmal dilatation.

No significant atherosclerotic plaque is seen by screening
sonography.

Maximum Diameter: 3.0 cm proximally, 2.3 cm mid, 2.1 cm distally

Incidentally noted hepatic cysts, minimally complicated, largest
x 4.5 x 4.8 cm, noted by prior CT.
IMPRESSION: Normal caliber abdominal aortic without evidence of abdominal aortic
aneurysm.

## 2017-07-16 ENCOUNTER — Other Ambulatory Visit: Payer: Self-pay | Admitting: Interventional Cardiology

## 2017-07-23 DIAGNOSIS — H401131 Primary open-angle glaucoma, bilateral, mild stage: Secondary | ICD-10-CM | POA: Diagnosis not present

## 2017-07-23 DIAGNOSIS — H25013 Cortical age-related cataract, bilateral: Secondary | ICD-10-CM | POA: Diagnosis not present

## 2017-07-23 DIAGNOSIS — H2513 Age-related nuclear cataract, bilateral: Secondary | ICD-10-CM | POA: Diagnosis not present

## 2017-10-13 ENCOUNTER — Other Ambulatory Visit: Payer: Self-pay | Admitting: Interventional Cardiology

## 2017-10-23 ENCOUNTER — Other Ambulatory Visit: Payer: Self-pay | Admitting: Interventional Cardiology

## 2017-10-24 DIAGNOSIS — M25552 Pain in left hip: Secondary | ICD-10-CM | POA: Diagnosis not present

## 2017-10-24 DIAGNOSIS — M545 Low back pain: Secondary | ICD-10-CM | POA: Diagnosis not present

## 2017-10-24 DIAGNOSIS — M25551 Pain in right hip: Secondary | ICD-10-CM | POA: Diagnosis not present

## 2017-10-24 NOTE — Telephone Encounter (Signed)
Pt's pharmacy is requesting a refill on tadalafil. Would Dr. Irish Lack like to refill this medication? Please address

## 2017-10-28 NOTE — Telephone Encounter (Signed)
Okay to refill? 

## 2017-11-05 ENCOUNTER — Other Ambulatory Visit: Payer: Self-pay | Admitting: Interventional Cardiology

## 2017-11-15 ENCOUNTER — Other Ambulatory Visit: Payer: Self-pay | Admitting: Interventional Cardiology

## 2017-11-18 NOTE — Telephone Encounter (Signed)
Pt's pharmacy is requesting a refill on Tadalafil. Would Dr. Irish Lack like to refill this medication? Please address

## 2017-12-29 ENCOUNTER — Telehealth: Payer: Self-pay | Admitting: Interventional Cardiology

## 2017-12-29 NOTE — Telephone Encounter (Signed)
Patient complaining of SOB with activity after having back problems and doctor prescribing percocet  5 mg for pain. Patient stopped taking pain medication, but SOB did not improve. Patient state he is use to walking 5 miles a day, and now when he walks he gets SOB. Patient is taking all his medications as listed. Made patient an appointment with one of our PAs to come in for an evaluation. Patient agreed to come in to see Estella Husk PA.

## 2017-12-29 NOTE — Telephone Encounter (Signed)
New Message:      Pt c/o Shortness Of Breath: STAT if SOB developed within the last 24 hours or pt is noticeably SOB on the phone  1. Are you currently SOB (can you hear that pt is SOB on the phone)? No  2. How long have you been experiencing SOB? About a week  3. Are you SOB when sitting or when up moving around? Moving around(exercising)  4. Are you currently experiencing any other symptoms? No

## 2017-12-31 ENCOUNTER — Encounter: Payer: Self-pay | Admitting: Physician Assistant

## 2017-12-31 ENCOUNTER — Ambulatory Visit (INDEPENDENT_AMBULATORY_CARE_PROVIDER_SITE_OTHER): Payer: Medicare Other | Admitting: Physician Assistant

## 2017-12-31 ENCOUNTER — Encounter (INDEPENDENT_AMBULATORY_CARE_PROVIDER_SITE_OTHER): Payer: Self-pay

## 2017-12-31 VITALS — BP 140/86 | HR 92 | Ht 66.0 in | Wt 212.0 lb

## 2017-12-31 DIAGNOSIS — R000 Tachycardia, unspecified: Secondary | ICD-10-CM | POA: Insufficient documentation

## 2017-12-31 DIAGNOSIS — I10 Essential (primary) hypertension: Secondary | ICD-10-CM

## 2017-12-31 DIAGNOSIS — R42 Dizziness and giddiness: Secondary | ICD-10-CM

## 2017-12-31 DIAGNOSIS — E782 Mixed hyperlipidemia: Secondary | ICD-10-CM

## 2017-12-31 MED ORDER — CARVEDILOL 12.5 MG PO TABS: 12.5000 mg | ORAL_TABLET | Freq: Two times a day (BID) | ORAL | 3 refills | Status: DC

## 2017-12-31 NOTE — Progress Notes (Signed)
Cardiology Office Note    Date:  12/31/2017   ID:  Benjamin Vega Mar 12, 1951, MRN [id number]  PCP:  Maude Leriche, PA-C  Cardiologist: Larae Grooms, MD  Chief Complaint  Patient presents with  . Shortness of Breath    History of Present Illness:  Benjamin Vega is a 67 y.o. male with history of hypertension, life screening done in 2016 &  2017 normal with no AAA normal ABIs and normal carotids.  Also has hyperlipidemia, erectile dysfunction.  Last saw Dr. Irish Lack 07/01/2017 and had to cut back on his walking 5 miles a day to 2 miles a day because of hip pain.  He was asked to switch from taking Aleve to Tylenol for pain relief.  Screening labs in January were all normal.  LDL 72.  Patient was having a lot of arthritis pain and was started on prednisone. While he was walking he became dizzy and sweaty so he stopped the prednisone.Hasn't tried to walk outside since he stopped steroids. He is doing stair stepper 35-40 min without problem.Dizziness has resolved.  His heart has been racing when he lays down at night and his heart rate was in the 90s at rest here.    Past Medical History:  Diagnosis Date  . Edema   . Erectile dysfunction   . HTN (hypertension)   . Hypercholesteremia   . Hyperlipemia     Past Surgical History:  Procedure Laterality Date  . COLONOSCOPY      Current Medications: Current Meds  Medication Sig  . amLODipine (NORVASC) 10 MG tablet Take 10 mg by mouth daily.  Marland Kitchen aspirin EC 81 MG tablet Take 1 tablet (81 mg total) by mouth daily.  Marland Kitchen atorvastatin (LIPITOR) 10 MG tablet Take 1 tablet every day  . COMBIGAN 0.2-0.5 % ophthalmic solution Place 1 drop into both eyes every 12 (twelve) hours.   Marland Kitchen olmesartan (BENICAR) 40 MG tablet Take 1 tablet (40 mg total) by mouth daily.  Marland Kitchen spironolactone (ALDACTONE) 25 MG tablet Take 1 tablet (25 mg total) by mouth daily.  . tadalafil (ADCIRCA/CIALIS) 20 MG tablet TAKE 1 TABLET EVERY DAY AS NEEDED  FOR   ERECTILE  DYSFUNCTION  . [DISCONTINUED] carvedilol (COREG) 6.25 MG tablet Take 1 tablet (6.25 mg total) by mouth 2 (two) times daily. Please keep upcoming appt in January before anymore refills. Thank you     Allergies:   Patient has no known allergies.   Social History   Socioeconomic History  . Marital status: Married    Spouse name: Not on file  . Number of children: Not on file  . Years of education: Not on file  . Highest education level: Not on file  Occupational History  . Not on file  Social Needs  . Financial resource strain: Not on file  . Food insecurity:    Worry: Not on file    Inability: Not on file  . Transportation needs:    Medical: Not on file    Non-medical: Not on file  Tobacco Use  . Smoking status: Former Research scientist (life sciences)  . Smokeless tobacco: Former Systems developer    Types: Chew  Substance and Sexual Activity  . Alcohol use: Yes    Alcohol/week: 2.4 oz    Types: 4 Shots of liquor per week  . Drug use: No  . Sexual activity: Not on file  Lifestyle  . Physical activity:    Days per week: Not on file    Minutes per session: Not  on file  . Stress: Not on file  Relationships  . Social connections:    Talks on phone: Not on file    Gets together: Not on file    Attends religious service: Not on file    Active member of club or organization: Not on file    Attends meetings of clubs or organizations: Not on file    Relationship status: Not on file  Other Topics Concern  . Not on file  Social History Narrative  . Not on file     Family History:  The patient's family history includes Diabetes in his mother; Esophageal cancer in his sister; Heart disease in his father; Hypertension in his father.   ROS:   Please see the history of present illness.    Review of Systems  Constitution: Negative.  HENT: Negative.   Cardiovascular: Positive for dyspnea on exertion and palpitations.  Respiratory: Negative.   Endocrine: Negative.   Hematologic/Lymphatic: Negative.     Musculoskeletal: Negative.   Gastrointestinal: Negative.   Genitourinary: Negative.   Neurological: Positive for dizziness.   All other systems reviewed and are negative.   PHYSICAL EXAM:   VS:  BP 140/86   Pulse 92   Ht 5\' 6"  (1.676 m)   Wt 212 lb (96.2 kg)   SpO2 96%   BMI 34.22 kg/m   Physical Exam  GEN: Well nourished, well developed, in no acute distress  Neck: no JVD, carotid bruits, or masses Cardiac:RRR; positive S4, 1/6 systolic murmur the left sternal border Respiratory:  clear to auscultation bilaterally, normal work of breathing GI: soft, nontender, nondistended, + BS Ext: without cyanosis, clubbing, or edema, Good distal pulses bilaterally Neuro:  Alert and Oriented x 3 Psych: euthymic mood, full affect  Wt Readings from Last 3 Encounters:  12/31/17 212 lb (96.2 kg)  07/01/17 214 lb 9.6 oz (97.3 kg)  05/10/16 211 lb 3.2 oz (95.8 kg)      Studies/Labs Reviewed:   EKG:  EKG is ordered today.  The ekg ordered today demonstrates normal sinus rhythm with LVH, no acute change  Recent Labs: 07/02/2017: ALT 21; BUN 20; Creatinine, Ser 1.26; Potassium 4.4; Sodium 139   Lipid Panel    Component Value Date/Time   CHOL 129 07/02/2017 0746   TRIG 63 07/02/2017 0746   HDL 44 07/02/2017 0746   CHOLHDL 2.9 07/02/2017 0746   CHOLHDL 2.9 05/10/2016 0845   VLDL 18 05/10/2016 0845   LDLCALC 72 07/02/2017 0746    Additional studies/ records that were reviewed today include:      ASSESSMENT:    1. Essential hypertension   2. Mixed hyperlipidemia   3. Tachycardia   4. Dizziness      PLAN:  In order of problems listed above:  Essential hypertension blood pressure little on the high side but is better at home according to the patient usually 130/70.  Increasing carvedilol today to 12.5 mg twice daily for palpitations and tachycardia.  Mixed hyperlipidemia on low-dose Lipitor LDL 72 in January  Tachycardia he notices palpitations and fast heart rates when  he lays down at night and resting heart rate was in the 90s here.  Will increase carvedilol to 12.5 mg twice daily.  I will see him back in 1 month.  Exertional dizziness when taking prednisone improved once he stopped the steroids.  Question secondary to elevated blood pressures on steroids. Medication Adjustments/Labs and Tests Ordered: Current medicines are reviewed at length with the patient today.  Concerns  regarding medicines are outlined above.  Medication changes, Labs and Tests ordered today are listed in the Patient Instructions below. Patient Instructions  Medication Instructions:  1. INCREASE CARVEDILOL TO 12.5MG  TWICE DAILY.  Labwork: None ordered  Testing/Procedures: None ordered  Follow-Up: Your physician wants you to follow-up in:    Any Other Special Instructions Will Be Listed Below (If Applicable).     If you need a refill on your cardiac medications before your next appointment, please call your pharmacy.      Signed, Ermalinda Barrios, PA-C  12/31/2017 1:53 PM    [facility] [place], [place], [place]  [address] Phone: [phone]; Fax: [phone]

## 2017-12-31 NOTE — Patient Instructions (Addendum)
Medication Instructions:  1. INCREASE CARVEDILOL TO 12.5MG  TWICE DAILY.  Labwork: None ordered  Testing/Procedures: None ordered  Follow-Up: Your physician recommends that you schedule a follow-up appointment on AUGUST 7TH, 2019 AT 8:30 AM. PLEASE ARRIVE 15 MINUTES PRIOR TO YOU APPOINTMENT.    Any Other Special Instructions Will Be Listed Below (If Applicable).     If you need a refill on your cardiac medications before your next appointment, please call your pharmacy.

## 2018-01-12 DIAGNOSIS — H401131 Primary open-angle glaucoma, bilateral, mild stage: Secondary | ICD-10-CM | POA: Diagnosis not present

## 2018-01-27 NOTE — Progress Notes (Signed)
Cardiology Office Note    Date:  01/28/2018   ID:  Benjamin Vega, Benjamin Vega [birth year], MRN [id number]  PCP:  Benjamin Leriche, PA-C  Cardiologist: Benjamin Grooms, MD  No chief complaint on file.   History of Present Illness:  Benjamin Vega is a 67 y.o. male with history of hypertension, life screening done in 2016 &  2017 normal with no AAA normal ABIs and normal carotids.  Also has hyperlipidemia, erectile dysfunction.  I saw the patient 12/31/2017 which time he was complaining of fast heart rates.  His blood pressure was on the high side as well although he says it is better at home.  Increase his carvedilol to 12.5 mg twice daily.  He also has some exertional dizziness while taking prednisone but improved off steroids.  Patient comes in for f/u. He works part time as a Administrator. When he rushes in the heat he complains of dyspnea on exertion.  He says he can do his yard work without difficulty and swelling in the heat when he is working hard that he becomes short of breath.  He denies chest tightness or pressure.  Tachycardia has improved greatly with increased carvedilol.   Past Medical History:  Diagnosis Date  . Edema   . Erectile dysfunction   . HTN (hypertension)   . Hypercholesteremia   . Hyperlipemia     Past Surgical History:  Procedure Laterality Date  . COLONOSCOPY      Current Medications: Current Meds  Medication Sig  . amLODipine (NORVASC) 10 MG tablet Take 10 mg by mouth daily.  Marland Kitchen aspirin EC 81 MG tablet Take 1 tablet (81 mg total) by mouth daily.  Marland Kitchen atorvastatin (LIPITOR) 10 MG tablet Take 1 tablet every day  . carvedilol (COREG) 12.5 MG tablet Take 1 tablet (12.5 mg total) by mouth 2 (two) times daily.  . COMBIGAN 0.2-0.5 % ophthalmic solution Place 1 drop into both eyes every 12 (twelve) hours.   Marland Kitchen olmesartan (BENICAR) 40 MG tablet Take 1 tablet (40 mg total) by mouth daily.  Marland Kitchen spironolactone (ALDACTONE) 25 MG tablet Take 1 tablet (25 mg total) by  mouth daily.  . tadalafil (ADCIRCA/CIALIS) 20 MG tablet TAKE 1 TABLET EVERY DAY AS NEEDED  FOR  ERECTILE  DYSFUNCTION     Allergies:   Patient has no known allergies.   Social History   Socioeconomic History  . Marital status: Married    Spouse name: Not on file  . Number of children: Not on file  . Years of education: Not on file  . Highest education level: Not on file  Occupational History  . Not on file  Social Needs  . Financial resource strain: Not on file  . Food insecurity:    Worry: Not on file    Inability: Not on file  . Transportation needs:    Medical: Not on file    Non-medical: Not on file  Tobacco Use  . Smoking status: Former Research scientist (life sciences)  . Smokeless tobacco: Former Systems developer    Types: Chew  Substance and Sexual Activity  . Alcohol use: Yes    Alcohol/week: 2.4 oz    Types: 4 Shots of liquor per week  . Drug use: No  . Sexual activity: Not on file  Lifestyle  . Physical activity:    Days per week: Not on file    Minutes per session: Not on file  . Stress: Not on file  Relationships  . Social connections:  Talks on phone: Not on file    Gets together: Not on file    Attends religious service: Not on file    Active member of club or organization: Not on file    Attends meetings of clubs or organizations: Not on file    Relationship status: Not on file  Other Topics Concern  . Not on file  Social History Narrative  . Not on file     Family History:  The patient's family history includes Diabetes in his mother; Esophageal cancer in his sister; Heart disease in his father; Hypertension in his father.   ROS:   Please see the history of present illness.    Review of Systems  Constitution: Negative.  HENT: Negative.   Cardiovascular: Positive for dyspnea on exertion.  Respiratory: Negative.   Endocrine: Negative.   Hematologic/Lymphatic: Negative.   Musculoskeletal: Negative.   Gastrointestinal: Negative.   Genitourinary: Negative.   Neurological:  Negative.    All other systems reviewed and are negative.   PHYSICAL EXAM:   VS:  BP (!) 122/52   Pulse 81   Ht 5\' 7"  (1.702 m)   Wt 217 lb (98.4 kg)   SpO2 97%   BMI 33.99 kg/m   Physical Exam  GEN: Well nourished, well developed, in no acute distress  Neck: no JVD, carotid bruits, or masses Cardiac:RRR; no murmurs, rubs, or gallops  Respiratory:  clear to auscultation bilaterally, normal work of breathing GI: soft, nontender, nondistended, + BS Ext: without cyanosis, clubbing, or edema, Good distal pulses bilaterally Neuro:  Alert and Oriented x 3 Psych: euthymic mood, full affect  Wt Readings from Last 3 Encounters:  01/28/18 217 lb (98.4 kg)  12/31/17 212 lb (96.2 kg)  07/01/17 214 lb 9.6 oz (97.3 kg)      Studies/Labs Reviewed:   EKG:  EKG is not ordered today.  EKG last office visit 12/2017 normal sinus rhythm with LVH no acute change. Recent Labs: 07/02/2017: ALT 21; BUN 20; Creatinine, Ser 1.26; Potassium 4.4; Sodium 139   Lipid Panel    Component Value Date/Time   CHOL 129 07/02/2017 0746   TRIG 63 07/02/2017 0746   HDL 44 07/02/2017 0746   CHOLHDL 2.9 07/02/2017 0746   CHOLHDL 2.9 05/10/2016 0845   VLDL 18 05/10/2016 0845   LDLCALC 72 07/02/2017 0746    Additional studies/ records that were reviewed today include:      ASSESSMENT:    1. Essential hypertension   2. Tachycardia   3. Mixed hyperlipidemia   4. Dyspnea on exertion      PLAN:  In order of problems listed above:   Essential hypertension blood pressure much better controlled on increased carvedilol  Tachycardia with palpitations carvedilol increase last office visit and no further complaints  Mixed hyperlipidemia LDL 72 on Lipitor 06/2017  Dyspnea on exertion when working and rushing in the heat.  Patient has never had a stress test or echo.  Will order these and have him follow-up with me in 1 month.   Medication Adjustments/Labs and Tests Ordered: Current medicines are  reviewed at length with the patient today.  Concerns regarding medicines are outlined above.  Medication changes, Labs and Tests ordered today are listed in the Patient Instructions below. Patient Instructions  Medication Instructions:  Your physician recommends that you continue on your current medications as directed. Please refer to the Current Medication list given to you today.   Labwork: None ordered  Testing/Procedures: None ordered  Follow-Up: Your  physician wants you to follow-up in: 1 year with Dr. Irish Lack. You will receive a reminder letter in the mail two months in advance. If you don't receive a letter, please call our office to schedule the follow-up appointment.   Any Other Special Instructions Will Be Listed Below (If Applicable).     If you need a refill on your cardiac medications before your next appointment, please call your pharmacy.      Sumner Boast, PA-C  01/28/2018 8:23 AM    [facility] [place], [place], [place]  [address] Phone: [phone]; Fax: [phone]

## 2018-01-28 ENCOUNTER — Encounter: Payer: Self-pay | Admitting: Physician Assistant

## 2018-01-28 ENCOUNTER — Ambulatory Visit (INDEPENDENT_AMBULATORY_CARE_PROVIDER_SITE_OTHER): Payer: Medicare Other | Admitting: Physician Assistant

## 2018-01-28 VITALS — BP 122/52 | HR 81 | Ht 67.0 in | Wt 217.0 lb

## 2018-01-28 DIAGNOSIS — I10 Essential (primary) hypertension: Secondary | ICD-10-CM | POA: Diagnosis not present

## 2018-01-28 DIAGNOSIS — R000 Tachycardia, unspecified: Secondary | ICD-10-CM | POA: Diagnosis not present

## 2018-01-28 DIAGNOSIS — R0609 Other forms of dyspnea: Secondary | ICD-10-CM | POA: Diagnosis not present

## 2018-01-28 DIAGNOSIS — E782 Mixed hyperlipidemia: Secondary | ICD-10-CM

## 2018-01-28 NOTE — Patient Instructions (Addendum)
Medication Instructions:  Your physician recommends that you continue on your current medications as directed. Please refer to the Current Medication list given to you today.   Labwork: None ordered  Testing/Procedures: Your physician has requested that you have an echocardiogram. Echocardiography is a painless test that uses sound waves to create images of your heart. It provides your doctor with information about the size and shape of your heart and how well your heart's chambers and valves are working. This procedure takes approximately one hour. There are no restrictions for this procedure.  Your physician has requested that you have en exercise stress myoview. For further information please visit [email or web address]. Please follow instruction sheet, as given.    Follow-Up: Your physician wants you to follow-up with  Benjamin Vega 9/10 AT 8:30 AM.  Any Other Special Instructions Will Be Listed Below (If Applicable).     If you need a refill on your cardiac medications before your next appointment, please call your pharmacy.   Echocardiogram An echocardiogram, or echocardiography, uses sound waves (ultrasound) to produce an image of your heart. The echocardiogram is simple, painless, obtained within a short period of time, and offers valuable information to your health care provider. The images from an echocardiogram can provide information such as:  Evidence of coronary artery disease (CAD).  Heart size.  Heart muscle function.  Heart valve function.  Aneurysm detection.  Evidence of a past heart attack.  Fluid buildup around the heart.  Heart muscle thickening.  Assess heart valve function.  Tell a health care provider about:  Any allergies you have.  All medicines you are taking, including vitamins, herbs, eye drops, creams, and over-the-counter medicines.  Any problems you or family members have had with anesthetic medicines.  Any blood disorders you  have.  Any surgeries you have had.  Any medical conditions you have.  Whether you are pregnant or may be pregnant. What happens before the procedure? No special preparation is needed. Eat and drink normally. What happens during the procedure?  In order to produce an image of your heart, gel will be applied to your chest and a wand-like tool (transducer) will be moved over your chest. The gel will help transmit the sound waves from the transducer. The sound waves will harmlessly bounce off your heart to allow the heart images to be captured in real-time motion. These images will then be recorded.  You may need an IV to receive a medicine that improves the quality of the pictures. What happens after the procedure? You may return to your normal schedule including diet, activities, and medicines, unless your health care provider tells you otherwise. This information is not intended to replace advice given to you by your health care provider. Make sure you discuss any questions you have with your health care provider. Document Released: 06/07/2000 Document Revised: 01/27/2016 Document Reviewed: 02/15/2013 Elsevier Interactive Patient Education  2017 [employer].

## 2018-01-29 ENCOUNTER — Telehealth (HOSPITAL_COMMUNITY): Payer: Self-pay | Admitting: *Deleted

## 2018-01-29 NOTE — Telephone Encounter (Signed)
Patient given detailed instructions per Myocardial Perfusion Study Information Sheet for the test on 02/04/18. Patient notified to arrive 15 minutes early and that it is imperative to arrive on time for appointment to keep from having the test rescheduled.  If you need to cancel or reschedule your appointment, please call the office within 24 hours of your appointment. . Patient verbalized understanding. Kirstie Peri

## 2018-02-04 ENCOUNTER — Ambulatory Visit (HOSPITAL_COMMUNITY): Payer: Medicare Other | Attending: Cardiology

## 2018-02-04 ENCOUNTER — Other Ambulatory Visit: Payer: Self-pay

## 2018-02-04 ENCOUNTER — Ambulatory Visit (HOSPITAL_BASED_OUTPATIENT_CLINIC_OR_DEPARTMENT_OTHER): Payer: Medicare Other

## 2018-02-04 DIAGNOSIS — E669 Obesity, unspecified: Secondary | ICD-10-CM | POA: Insufficient documentation

## 2018-02-04 DIAGNOSIS — I509 Heart failure, unspecified: Secondary | ICD-10-CM | POA: Insufficient documentation

## 2018-02-04 DIAGNOSIS — R0609 Other forms of dyspnea: Secondary | ICD-10-CM

## 2018-02-04 DIAGNOSIS — R600 Localized edema: Secondary | ICD-10-CM | POA: Diagnosis not present

## 2018-02-04 DIAGNOSIS — Z6833 Body mass index (BMI) 33.0-33.9, adult: Secondary | ICD-10-CM | POA: Diagnosis not present

## 2018-02-04 DIAGNOSIS — I110 Hypertensive heart disease with heart failure: Secondary | ICD-10-CM | POA: Diagnosis not present

## 2018-02-04 DIAGNOSIS — E785 Hyperlipidemia, unspecified: Secondary | ICD-10-CM | POA: Diagnosis not present

## 2018-02-04 LAB — MYOCARDIAL PERFUSION IMAGING
CHL CUP MPHR: 152 {beats}/min
CHL CUP NUCLEAR SDS: 0
CHL CUP NUCLEAR SRS: 2
CHL CUP NUCLEAR SSS: 2
CHL CUP RESTING HR STRESS: 69 {beats}/min
CSEPED: 5 min
CSEPEW: 7 METS
CSEPPHR: 139 {beats}/min
Exercise duration (sec): 0 s
LHR: 0.39
LV dias vol: 90 mL (ref 62–150)
LV sys vol: 28 mL
NUC STRESS TID: 0.74
Percent HR: 91 %

## 2018-02-04 MED ORDER — TECHNETIUM TC 99M TETROFOSMIN IV KIT
10.2000 | PACK | Freq: Once | INTRAVENOUS | Status: AC | PRN
  Administered 2018-02-04: 10.2 via INTRAVENOUS
  Filled 2018-02-04: qty 11

## 2018-02-04 MED ORDER — TECHNETIUM TC 99M TETROFOSMIN IV KIT
31.2000 | PACK | Freq: Once | INTRAVENOUS | Status: AC | PRN
  Administered 2018-02-04: 31.2 via INTRAVENOUS
  Filled 2018-02-04: qty 32

## 2018-03-02 NOTE — Progress Notes (Signed)
Cardiology Office Note    Date:  03/03/2018   ID:  Benjamin Vega, Benjamin Vega 1950-10-13, MRN [id number]  PCP:  Maude Leriche, PA-C  Cardiologist: Larae Grooms, MD  No chief complaint on file.   History of Present Illness:  Benjamin Vega is a 67 y.o. male  with history of hypertension, life screening done in 2016 &  2017 normal with no AAA normal ABIs and normal carotids.  Also has hyperlipidemia, erectile dysfunction.   I saw the patient 12/31/2017 which time he was complaining of fast heart rates.  His blood pressure was on the high side as well although he says it is better at home.  Increase his carvedilol to 12.5 mg twice daily.  He also has some exertional dizziness while taking prednisone but improved off steroids.  I saw the patient back to 01/28/2018 at which time blood pressure and tachycardia was much better controlled on increased carvedilol.  He was complaining of dyspnea on exertion when he rushes in the heat.  2D echo 02/04/2018 showed normal LVEF with grade 1 DD. Nuclear stress test was normal.  Patient comes in today for follow-up.  He is feeling much better.  Denies any more shortness of breath with exertion.  Walking 2 miles daily without difficulty.  He thinks most of his trouble came from taking the steroids.  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    Past Medical History:  Diagnosis Date  . Edema   . Erectile dysfunction   . HTN (hypertension)   . Hypercholesteremia   . Hyperlipemia     Past Surgical History:  Procedure Laterality Date  . COLONOSCOPY      Current Medications: Current Meds  Medication Sig  . amLODipine (NORVASC) 10 MG tablet Take 10 mg by mouth daily.  Marland Kitchen aspirin EC 81 MG tablet Take 1 tablet (81 mg total) by mouth daily.  Marland Kitchen atorvastatin (LIPITOR) 10 MG tablet Take 1 tablet every day  . carvedilol (COREG) 12.5 MG tablet Take 1 tablet (12.5 mg total) by mouth 2 (two) times daily.  . COMBIGAN 0.2-0.5 % ophthalmic solution Place 1 drop into  both eyes every 12 (twelve) hours.   Marland Kitchen olmesartan (BENICAR) 40 MG tablet Take 1 tablet (40 mg total) by mouth daily.  Marland Kitchen spironolactone (ALDACTONE) 25 MG tablet Take 1 tablet (25 mg total) by mouth daily.  . tadalafil (ADCIRCA/CIALIS) 20 MG tablet TAKE 1 TABLET EVERY DAY AS NEEDED  FOR  ERECTILE  DYSFUNCTION     Allergies:   Patient has no known allergies.   Social History   Socioeconomic History  . Marital status: Married    Spouse name: Not on file  . Number of children: Not on file  . Years of education: Not on file  . Highest education level: Not on file  Occupational History  . Not on file  Social Needs  . Financial resource strain: Not on file  . Food insecurity:    Worry: Not on file    Inability: Not on file  . Transportation needs:    Medical: Not on file    Non-medical: Not on file  Tobacco Use  . Smoking status: Former Research scientist (life sciences)  . Smokeless tobacco: Former Systems developer    Types: Chew  Substance and Sexual Activity  . Alcohol use: Yes    Alcohol/week: 4.0 standard drinks    Types: 4 Shots of liquor per week  . Drug use: No  . Sexual activity: Not on file  Lifestyle  .  Physical activity:    Days per week: Not on file    Minutes per session: Not on file  . Stress: Not on file  Relationships  . Social connections:    Talks on phone: Not on file    Gets together: Not on file    Attends religious service: Not on file    Active member of club or organization: Not on file    Attends meetings of clubs or organizations: Not on file    Relationship status: Not on file  Other Topics Concern  . Not on file  Social History Narrative  . Not on file     Family History:  The patient's family history includes Diabetes in his mother; Esophageal cancer in his sister; Heart disease in his father; Hypertension in his father.   ROS:   Please see the history of present illness.    Review of Systems  Constitution: Negative.  HENT: Negative.   Cardiovascular: Negative.     Respiratory: Negative.   Endocrine: Negative.   Hematologic/Lymphatic: Negative.   Musculoskeletal: Positive for back pain and myalgias.  Gastrointestinal: Negative.   Genitourinary: Negative.   Neurological: Negative.    All other systems reviewed and are negative.   PHYSICAL EXAM:   VS:  BP 126/84   Pulse 79   Ht 5\' 7"  (1.702 m)   Wt 215 lb 9.6 oz (97.8 kg)   SpO2 97%   BMI 33.77 kg/m   Physical Exam  GEN: Well nourished, well developed, in no acute distress  Neck: no JVD, carotid bruits, or masses Cardiac:RRR; positive S4, 1/6 systolic murmur at the left sternal border Respiratory:  clear to auscultation bilaterally, normal work of breathing GI: soft, nontender, nondistended, + BS Ext: without cyanosis, clubbing, or edema, Good distal pulses bilaterally Neuro:  Alert and Oriented x 3 Psych: euthymic mood, full affect  Wt Readings from Last 3 Encounters:  03/03/18 215 lb 9.6 oz (97.8 kg)  02/04/18 217 lb (98.4 kg)  01/28/18 217 lb (98.4 kg)      Studies/Labs Reviewed:   EKG:  EKG is not ordered today.  Recent Labs: 07/02/2017: ALT 21; BUN 20; Creatinine, Ser 1.26; Potassium 4.4; Sodium 139   Lipid Panel    Component Value Date/Time   CHOL 129 07/02/2017 0746   TRIG 63 07/02/2017 0746   HDL 44 07/02/2017 0746   CHOLHDL 2.9 07/02/2017 0746   CHOLHDL 2.9 05/10/2016 0845   VLDL 18 05/10/2016 0845   LDLCALC 72 07/02/2017 0746    Additional studies/ records that were reviewed today include:  Nuclear stress test 02/04/2018   Nuclear stress EF: 69%. Normal wall motion.  There was no ST segment deviation noted during stress.  There were no perfusion defects identified.  This is a low risk study.   Candee Furbish, MD    2D echo 8/14/2019Study Conclusions   - Left ventricle: The cavity size was normal. There was mild focal   basal hypertrophy of the septum. Systolic function was normal.   The estimated ejection fraction was in the range of 60% to 65%.   Wall  motion was normal; there were no regional wall motion   abnormalities. Doppler parameters are consistent with abnormal   left ventricular relaxation (grade 1 diastolic dysfunction).        ASSESSMENT:    1. Essential hypertension   2. Dyspnea on exertion   3. Tachycardia   4. Mixed hyperlipidemia      PLAN:  In order of  problems listed above:  Essential hypertension blood pressure well controlled  Dyspnea on exertion 2D echo with normal LV function grade 1 DD, nuclear stress test was normal, symptoms have resolved for the most part since he is off steroids.  Tachycardia improved with increasing carvedilol  Hyperlipidemia continue Lipitor.  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    Medication Adjustments/Labs and Tests Ordered: Current medicines are reviewed at length with the patient today.  Concerns regarding medicines are outlined above.  Medication changes, Labs and Tests ordered today are listed in the Patient Instructions below. There are no Patient Instructions on file for this visit.   Sumner Boast, PA-C  03/03/2018 8:34 AM    [facility] [address], [place], [place]  [address] Phone: [phone]; Fax: [phone]

## 2018-03-03 ENCOUNTER — Ambulatory Visit (INDEPENDENT_AMBULATORY_CARE_PROVIDER_SITE_OTHER): Payer: Medicare Other | Admitting: Physician Assistant

## 2018-03-03 ENCOUNTER — Encounter: Payer: Self-pay | Admitting: Physician Assistant

## 2018-03-03 VITALS — BP 126/84 | HR 79 | Ht 67.0 in | Wt 215.6 lb

## 2018-03-03 DIAGNOSIS — R000 Tachycardia, unspecified: Secondary | ICD-10-CM

## 2018-03-03 DIAGNOSIS — I10 Essential (primary) hypertension: Secondary | ICD-10-CM | POA: Diagnosis not present

## 2018-03-03 DIAGNOSIS — E782 Mixed hyperlipidemia: Secondary | ICD-10-CM | POA: Diagnosis not present

## 2018-03-03 DIAGNOSIS — R0609 Other forms of dyspnea: Secondary | ICD-10-CM

## 2018-03-03 NOTE — Patient Instructions (Signed)
Medication Instructions:  Your physician recommends that you continue on your current medications as directed. Please refer to the Current Medication list given to you today.   Labwork: Your physician recommends that you return for a FASTING lipid profile: in January   Testing/Procedures: None ordered  Follow-Up: Your physician wants you to follow-up in: January with Dr. Irish Lack. You will receive a reminder letter in the mail two months in advance. If you don't receive a letter, please call our office to schedule the follow-up appointment.   Any Other Special Instructions Will Be Listed Below (If Applicable).     If you need a refill on your cardiac medications before your next appointment, please call your pharmacy.

## 2018-04-06 DIAGNOSIS — Z23 Encounter for immunization: Secondary | ICD-10-CM | POA: Diagnosis not present

## 2018-04-15 ENCOUNTER — Other Ambulatory Visit: Payer: Self-pay | Admitting: Interventional Cardiology

## 2018-07-01 ENCOUNTER — Other Ambulatory Visit: Payer: Self-pay | Admitting: Interventional Cardiology

## 2018-07-09 ENCOUNTER — Ambulatory Visit (INDEPENDENT_AMBULATORY_CARE_PROVIDER_SITE_OTHER): Payer: Medicare Other | Admitting: Interventional Cardiology

## 2018-07-09 ENCOUNTER — Encounter: Payer: Self-pay | Admitting: Interventional Cardiology

## 2018-07-09 VITALS — BP 138/88 | HR 79 | Ht 67.0 in | Wt 214.2 lb

## 2018-07-09 DIAGNOSIS — E669 Obesity, unspecified: Secondary | ICD-10-CM

## 2018-07-09 DIAGNOSIS — E782 Mixed hyperlipidemia: Secondary | ICD-10-CM

## 2018-07-09 DIAGNOSIS — I10 Essential (primary) hypertension: Secondary | ICD-10-CM

## 2018-07-09 DIAGNOSIS — K429 Umbilical hernia without obstruction or gangrene: Secondary | ICD-10-CM | POA: Diagnosis not present

## 2018-07-09 LAB — COMPREHENSIVE METABOLIC PANEL
ALT: 19 IU/L (ref 0–44)
AST: 15 IU/L (ref 0–40)
Albumin/Globulin Ratio: 1.7 (ref 1.2–2.2)
Albumin: 4.5 g/dL (ref 3.6–4.8)
Alkaline Phosphatase: 72 IU/L (ref 39–117)
BUN/Creatinine Ratio: 13 (ref 10–24)
BUN: 15 mg/dL (ref 8–27)
Bilirubin Total: 0.4 mg/dL (ref 0.0–1.2)
CO2: 23 mmol/L (ref 20–29)
Calcium: 9.6 mg/dL (ref 8.6–10.2)
Chloride: 100 mmol/L (ref 96–106)
Creatinine, Ser: 1.12 mg/dL (ref 0.76–1.27)
GFR calc Af Amer: 77 mL/min/{1.73_m2} (ref >59)
GFR calc non Af Amer: 67 mL/min/{1.73_m2} (ref >59)
GLOBULIN, TOTAL: 2.6 g/dL (ref 1.5–4.5)
Glucose: 119 mg/dL — ABNORMAL HIGH (ref 65–99)
Potassium: 4 mmol/L (ref 3.5–5.2)
Sodium: 140 mmol/L (ref 134–144)
Total Protein: 7.1 g/dL (ref 6.0–8.5)

## 2018-07-09 LAB — LIPID PANEL
CHOL/HDL RATIO: 2.7 ratio (ref 0.0–5.0)
Cholesterol, Total: 131 mg/dL (ref 100–199)
HDL: 48 mg/dL (ref >39)
LDL Calculated: 67 mg/dL (ref 0–99)
TRIGLYCERIDES: 81 mg/dL (ref 0–149)
VLDL Cholesterol Cal: 16 mg/dL (ref 5–40)

## 2018-07-09 NOTE — Patient Instructions (Signed)
Medication Instructions:  Your physician recommends that you continue on your current medications as directed. Please refer to the Current Medication list given to you today.  If you need a refill on your cardiac medications before your next appointment, please call your pharmacy.   Lab work: TODAY: CMET, LIPIDS  If you have labs (blood work) drawn today and your tests are completely normal, you will receive your results only by: Marland Kitchen MyChart Message (if you have MyChart) OR . A paper copy in the mail If you have any lab test that is abnormal or we need to change your treatment, we will call you to review the results.  Testing/Procedures: None ordered  Follow-Up: At [facility], you and your health needs are our priority.  As part of our continuing mission to provide you with exceptional heart care, we have created designated Provider Care Teams.  These Care Teams include your primary Cardiologist (physician) and Advanced Practice Providers (APPs -  Physician Assistants and Nurse Practitioners) who all work together to provide you with the care you need, when you need it. . You will need a follow up appointment in 1 year.  Please call our office 2 months in advance to schedule this appointment.  You may see Casandra Doffing, MD or one of the following Advanced Practice Providers on your designated Care Team:   . Lyda Jester, PA-C . Dayna Dunn, PA-C . Ermalinda Barrios, PA-C  Any Other Special Instructions Will Be Listed Below (If Applicable).

## 2018-07-09 NOTE — Progress Notes (Signed)
Cardiology Office Note   Date:  07/09/2018   ID:  Sivan, Quast 08/04/1950, MRN [id number]  PCP:  Scifres, Earlie Server, PA-C    No chief complaint on file.  HTN  Wt Readings from Last 3 Encounters:  07/09/18 214 lb 3.2 oz (97.2 kg)  03/03/18 215 lb 9.6 oz (97.8 kg)  02/04/18 217 lb (98.4 kg)       History of Present Illness: Benjamin Vega is a 68 y.o. male  with history of hypertension, life screening done in 2016&2017 normal with no AAA normal ABIs and normal carotids. Also has hyperlipidemia, erectile dysfunction.  He has had chronic hip pain which limited walking, in the past. THis is due to arthritis.  He had DOE when he took steroids for the arthritis.  BP has typically higher in the MDs office.  Felt some tachycardia a few months ago.  He thinks this was due to steroids.  Improved with carvedilol.    He still walks.  Denies : Chest pain. Dizziness. Leg edema. Nitroglycerin use. Orthopnea. Palpitations. Paroxysmal nocturnal dyspnea. Shortness of breath. Syncope.      Past Medical History:  Diagnosis Date  . Edema   . Erectile dysfunction   . HTN (hypertension)   . Hypercholesteremia   . Hyperlipemia     Past Surgical History:  Procedure Laterality Date  . COLONOSCOPY       Current Outpatient Medications  Medication Sig Dispense Refill  . amLODipine (NORVASC) 10 MG tablet TAKE 1 TABLET EVERY DAY 90 tablet 3  . aspirin EC 81 MG tablet Take 1 tablet (81 mg total) by mouth daily. 90 tablet 3  . atorvastatin (LIPITOR) 10 MG tablet TAKE 1 TABLET EVERY DAY 90 tablet 2  . carvedilol (COREG) 12.5 MG tablet Take 1 tablet (12.5 mg total) by mouth 2 (two) times daily. 180 tablet 3  . COMBIGAN 0.2-0.5 % ophthalmic solution Place 1 drop into both eyes every 12 (twelve) hours.     Marland Kitchen olmesartan (BENICAR) 40 MG tablet TAKE 1 TABLET EVERY DAY 90 tablet 3  . spironolactone (ALDACTONE) 25 MG tablet TAKE 1 TABLET EVERY DAY 90 tablet 3  . tadalafil  (ADCIRCA/CIALIS) 20 MG tablet TAKE 1 TABLET EVERY DAY AS NEEDED  FOR  ERECTILE  DYSFUNCTION 10 tablet 3   No current facility-administered medications for this visit.     Allergies:   Patient has no known allergies.    Social History:  The patient  reports that he has quit smoking. He has quit using smokeless tobacco.  His smokeless tobacco use included chew. He reports current alcohol use of about 4.0 standard drinks of alcohol per week. He reports that he does not use drugs.   Family History:  The patient's family history includes Diabetes in his mother; Esophageal cancer in his sister; Heart disease in his father; Hypertension in his father.    ROS:  Please see the history of present illness.   Otherwise, review of systems are positive for hip pain. All other systems are reviewed and negative.    PHYSICAL EXAM: VS:  BP 138/88   Pulse 79   Ht 5\' 7"  (1.702 m)   Wt 214 lb 3.2 oz (97.2 kg)   SpO2 95%   BMI 33.55 kg/m  , BMI Body mass index is 33.55 kg/m. GEN: Well nourished, well developed, in no acute distress  HEENT: normal  Neck: no JVD, carotid bruits, or masses Cardiac: RRR; no murmurs, rubs, or gallops,no edema  Respiratory:  clear to auscultation bilaterally, normal work of breathing GI: soft, nontender, nondistended, + BS, obese, umbilical hernia MS: no deformity or atrophy  Skin: warm and dry, no rash Neuro:  Strength and sensation are intact Psych: euthymic mood, full affect   EKG:   The ekg ordered 7/19 demonstrates normal sinus rhythm, no ST segment changes   Recent Labs: No results found for requested labs within last 8760 hours.   Lipid Panel    Component Value Date/Time   CHOL 129 07/02/2017 0746   TRIG 63 07/02/2017 0746   HDL 44 07/02/2017 0746   CHOLHDL 2.9 07/02/2017 0746   CHOLHDL 2.9 05/10/2016 0845   VLDL 18 05/10/2016 0845   LDLCALC 72 07/02/2017 0746     Other studies Reviewed: Additional studies/ records that were reviewed today with  results demonstrating: Stress test and echocardiogram reviewed.   ASSESSMENT AND PLAN:  1. HTN: Controlled on medications.  Continue current medicines.  Repeat blood pressure on my check was 122/74.  No further tachycardia. 2. Obesity: Try to increase exercise as much as possible.  Would recommend trying to cut out sugars from the diet. 3. Erectile dysfunction: Using Cialis.  Not using nitroglycerin. 4. Hyperlipidemia: Recheck lipids today. 5. Umbilical hernia: Not causing him any problems. 6. 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  He had a normal stress test in August 2019.  He had normal ejection fraction in August 2019 by echocardiogram as well.   Current medicines are reviewed at length with the patient today.  The patient concerns regarding his medicines were addressed.  The following changes have been made:  No change  Labs/ tests ordered today include:  No orders of the defined types were placed in this encounter.   Recommend 150 minutes/week of aerobic exercise Low fat, low carb, high fiber diet recommended  Disposition:   FU in 1 year   Signed, Larae Grooms, MD  07/09/2018 9:24 AM    [facility] [address], [place], [place]  [address] Phone: [phone]; Fax: [phone]

## 2018-07-24 DIAGNOSIS — H25013 Cortical age-related cataract, bilateral: Secondary | ICD-10-CM | POA: Diagnosis not present

## 2018-07-24 DIAGNOSIS — H2513 Age-related nuclear cataract, bilateral: Secondary | ICD-10-CM | POA: Diagnosis not present

## 2018-07-24 DIAGNOSIS — H401131 Primary open-angle glaucoma, bilateral, mild stage: Secondary | ICD-10-CM | POA: Diagnosis not present

## 2018-11-18 ENCOUNTER — Other Ambulatory Visit: Payer: Self-pay | Admitting: Physician Assistant

## 2018-12-01 DIAGNOSIS — H43811 Vitreous degeneration, right eye: Secondary | ICD-10-CM | POA: Diagnosis not present

## 2018-12-28 DIAGNOSIS — H43811 Vitreous degeneration, right eye: Secondary | ICD-10-CM | POA: Diagnosis not present

## 2019-01-25 DIAGNOSIS — H43811 Vitreous degeneration, right eye: Secondary | ICD-10-CM | POA: Diagnosis not present

## 2019-01-25 DIAGNOSIS — H401131 Primary open-angle glaucoma, bilateral, mild stage: Secondary | ICD-10-CM | POA: Diagnosis not present

## 2019-02-12 ENCOUNTER — Other Ambulatory Visit: Payer: Self-pay | Admitting: Interventional Cardiology

## 2019-04-02 DIAGNOSIS — Z23 Encounter for immunization: Secondary | ICD-10-CM | POA: Diagnosis not present

## 2019-04-13 ENCOUNTER — Other Ambulatory Visit: Payer: Self-pay | Admitting: Physician Assistant

## 2019-04-28 ENCOUNTER — Other Ambulatory Visit: Payer: Self-pay | Admitting: Interventional Cardiology

## 2019-07-13 NOTE — Progress Notes (Signed)
Cardiology Office Note   Date:  07/15/2019   ID:  Bayard, Defusco 08/15/1950, MRN [id number]  PCP:  Scifres, Earlie Server, PA-C    No chief complaint on file.  HTN  Wt Readings from Last 3 Encounters:  07/15/19 219 lb (99.3 kg)  07/09/18 214 lb 3.2 oz (97.2 kg)  03/03/18 215 lb 9.6 oz (97.8 kg)       History of Present Illness: Benjamin Vega is a 69 y.o. male  with history of hypertension, life screening done in 2016&2017 normal with no AAA normal ABIs and normal carotids. Also has hyperlipidemia, erectile dysfunction.  He has had chronic hip pain which limited walking, in the past. THis is due to arthritis.  He had DOE when he took steroids for the arthritis.  BP has typically higher in the MDs office.  Felt some tachycardia in the past which he thinks this was due to steroids.  Improved with carvedilol.    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  He had a normal stress test in August 2019.  He had normal ejection fraction in August 2019 by echocardiogram as well.  Since the last visit, BP has been in the [id number] systolic range typically.   Denies : Chest pain. Dizziness. Leg edema. Nitroglycerin use. Orthopnea. Palpitations. Paroxysmal nocturnal dyspnea. Shortness of breath. Syncope.   He is still walking some.  Limited by back pain.    He is interested in the vaccine, but could not get an appt.     Past Medical History:  Diagnosis Date  . Edema   . Erectile dysfunction   . HTN (hypertension)   . Hypercholesteremia   . Hyperlipemia     Past Surgical History:  Procedure Laterality Date  . COLONOSCOPY       Current Outpatient Medications  Medication Sig Dispense Refill  . amLODipine (NORVASC) 10 MG tablet TAKE 1 TABLET EVERY DAY 90 tablet 0  . aspirin EC 81 MG tablet Take 1 tablet (81 mg total) by mouth daily. 90 tablet 3  . atorvastatin (LIPITOR) 10 MG tablet TAKE 1 TABLET EVERY DAY 90 tablet 1  .  carvedilol (COREG) 12.5 MG tablet TAKE 1 TABLET TWICE DAILY 180 tablet 0  . COMBIGAN 0.2-0.5 % ophthalmic solution Place 1 drop into both eyes every 12 (twelve) hours.     Marland Kitchen olmesartan (BENICAR) 40 MG tablet TAKE 1 TABLET EVERY DAY 90 tablet 0  . spironolactone (ALDACTONE) 25 MG tablet TAKE 1 TABLET EVERY DAY 90 tablet 0  . tadalafil (ADCIRCA/CIALIS) 20 MG tablet TAKE 1 TABLET EVERY DAY AS NEEDED  FOR  ERECTILE  DYSFUNCTION 10 tablet 3   No current facility-administered medications for this visit.    Allergies:   Patient has no known allergies.    Social History:  The patient  reports that he has quit smoking. He has quit using smokeless tobacco.  His smokeless tobacco use included chew. He reports current alcohol use of about 4.0 standard drinks of alcohol per week. He reports that he does not use drugs.   Family History:  The patient's family history includes Diabetes in his mother; Esophageal cancer in his sister; Heart disease in his father; Hypertension in his father.    ROS:  Please see the history of present illness.   Otherwise, review of systems are positive for back pain.   All other systems are reviewed and negative.    PHYSICAL EXAM: VS:  BP 128/86   Pulse 67   Ht 5\' 7"  (1.702 m)   Wt 219 lb (99.3 kg)   SpO2 96%   BMI 34.30 kg/m  , BMI Body mass index is 34.3 kg/m. GEN: Well nourished, well developed, in no acute distress  HEENT: normal  Neck: no JVD, carotid bruits, or masses Cardiac: RRR; no murmurs, rubs, or gallops,no edema  Respiratory:  clear to auscultation bilaterally, normal work of breathing GI: soft, nontender, nondistended, + BS MS: no deformity or atrophy  Skin: warm and dry, no rash Neuro:  Strength and sensation are intact Psych: euthymic mood, full affect   EKG:   The ekg ordered today demonstrates NSR, no ST segment changes   Recent Labs: No results found for requested labs within last 8760 hours.   Lipid Panel    Component Value  Date/Time   CHOL 131 07/09/2018 0936   TRIG 81 07/09/2018 0936   HDL 48 07/09/2018 0936   CHOLHDL 2.7 07/09/2018 0936   CHOLHDL 2.9 05/10/2016 0845   VLDL 18 05/10/2016 0845   LDLCALC 67 07/09/2018 0936     Other studies Reviewed: Additional studies/ records that were reviewed today with results demonstrating: 2020 labs reviewed.   ASSESSMENT AND PLAN:  1. HTN: The current medical regimen is effective;  continue present plan and medications. 2. Obesity: stable.  Difficulty losing weight during the past COVID.  3. Erectile dysfunction: refill Cialis,.  4. Hyperlipidemia: Check labs.  COntrolled in 06/2018. 5. COVID vaccine recommended.     Current medicines are reviewed at length with the patient today.  The patient concerns regarding his medicines were addressed.  The following changes have been made:  No change  Labs/ tests ordered today include:  No orders of the defined types were placed in this encounter.   Recommend 150 minutes/week of aerobic exercise Low fat, low carb, high fiber diet recommended  Disposition:   FU in 1 year   Signed, Larae Grooms, MD  07/15/2019 8:34 AM    [facility] [address], [place], [place]  [address] Phone: [phone]; Fax: [phone]

## 2019-07-15 ENCOUNTER — Ambulatory Visit (INDEPENDENT_AMBULATORY_CARE_PROVIDER_SITE_OTHER): Payer: Medicare Other | Admitting: Interventional Cardiology

## 2019-07-15 ENCOUNTER — Other Ambulatory Visit: Payer: Self-pay

## 2019-07-15 ENCOUNTER — Encounter: Payer: Self-pay | Admitting: Interventional Cardiology

## 2019-07-15 VITALS — BP 128/86 | HR 67 | Ht 67.0 in | Wt 219.0 lb

## 2019-07-15 DIAGNOSIS — E782 Mixed hyperlipidemia: Secondary | ICD-10-CM | POA: Diagnosis not present

## 2019-07-15 DIAGNOSIS — E669 Obesity, unspecified: Secondary | ICD-10-CM | POA: Diagnosis not present

## 2019-07-15 DIAGNOSIS — N529 Male erectile dysfunction, unspecified: Secondary | ICD-10-CM | POA: Diagnosis not present

## 2019-07-15 DIAGNOSIS — I10 Essential (primary) hypertension: Secondary | ICD-10-CM

## 2019-07-15 LAB — CBC
Hematocrit: 42.2 % (ref 37.5–51.0)
Hemoglobin: 14.3 g/dL (ref 13.0–17.7)
MCH: 30.8 pg (ref 26.6–33.0)
MCHC: 33.9 g/dL (ref 31.5–35.7)
MCV: 91 fL (ref 79–97)
Platelets: 203 10*3/uL (ref 150–450)
RBC: 4.65 x10E6/uL (ref 4.14–5.80)
RDW: 13.8 % (ref 11.6–15.4)
WBC: 5.5 10*3/uL (ref 3.4–10.8)

## 2019-07-15 LAB — COMPREHENSIVE METABOLIC PANEL
ALT: 31 IU/L (ref 0–44)
AST: 25 IU/L (ref 0–40)
Albumin/Globulin Ratio: 1.8 (ref 1.2–2.2)
Albumin: 4.7 g/dL (ref 3.8–4.8)
Alkaline Phosphatase: 68 IU/L (ref 39–117)
BUN/Creatinine Ratio: 15 (ref 10–24)
BUN: 19 mg/dL (ref 8–27)
Bilirubin Total: 0.4 mg/dL (ref 0.0–1.2)
CO2: 24 mmol/L (ref 20–29)
Calcium: 9.8 mg/dL (ref 8.6–10.2)
Chloride: 101 mmol/L (ref 96–106)
Creatinine, Ser: 1.24 mg/dL (ref 0.76–1.27)
GFR calc Af Amer: 68 mL/min/{1.73_m2} (ref >59)
GFR calc non Af Amer: 59 mL/min/{1.73_m2} — ABNORMAL LOW (ref >59)
Globulin, Total: 2.6 g/dL (ref 1.5–4.5)
Glucose: 120 mg/dL — ABNORMAL HIGH (ref 65–99)
Potassium: 4.4 mmol/L (ref 3.5–5.2)
Sodium: 139 mmol/L (ref 134–144)
Total Protein: 7.3 g/dL (ref 6.0–8.5)

## 2019-07-15 LAB — LIPID PANEL
Chol/HDL Ratio: 2.8 ratio (ref 0.0–5.0)
Cholesterol, Total: 145 mg/dL (ref 100–199)
HDL: 52 mg/dL (ref >39)
LDL Chol Calc (NIH): 73 mg/dL (ref 0–99)
Triglycerides: 112 mg/dL (ref 0–149)
VLDL Cholesterol Cal: 20 mg/dL (ref 5–40)

## 2019-07-15 MED ORDER — SPIRONOLACTONE 25 MG PO TABS: 25.0000 mg | ORAL_TABLET | Freq: Every day | ORAL | 3 refills | Status: DC

## 2019-07-15 MED ORDER — CARVEDILOL 12.5 MG PO TABS: 12.5000 mg | ORAL_TABLET | Freq: Two times a day (BID) | ORAL | 3 refills | Status: DC

## 2019-07-15 MED ORDER — ATORVASTATIN CALCIUM 10 MG PO TABS: 10.0000 mg | ORAL_TABLET | Freq: Every day | ORAL | 3 refills | Status: DC

## 2019-07-15 MED ORDER — AMLODIPINE BESYLATE 10 MG PO TABS: 10.0000 mg | ORAL_TABLET | Freq: Every day | ORAL | 3 refills | Status: DC

## 2019-07-15 NOTE — Patient Instructions (Signed)
Medication Instructions:  Your physician recommends that you continue on your current medications as directed. Please refer to the Current Medication list given to you today.  *If you need a refill on your cardiac medications before your next appointment, please call your pharmacy*  Lab Work: TODAY: CBC, CMET, LIPIDS  If you have labs (blood work) drawn today and your tests are completely normal, you will receive your results only by: Marland Kitchen MyChart Message (if you have MyChart) OR . A paper copy in the mail If you have any lab test that is abnormal or we need to change your treatment, we will call you to review the results.  Testing/Procedures: None ordered  Follow-Up: At [facility], you and your health needs are our priority.  As part of our continuing mission to provide you with exceptional heart care, we have created designated Provider Care Teams.  These Care Teams include your primary Cardiologist (physician) and Advanced Practice Providers (APPs -  Physician Assistants and Nurse Practitioners) who all work together to provide you with the care you need, when you need it.  Your next appointment:   12 month(s)  The format for your next appointment:   In Person  Provider:   You may see Larae Grooms, MD or one of the following Advanced Practice Providers on your designated Care Team:    Melina Copa, PA-C  Ermalinda Barrios, PA-C   Other Instructions We are recommending the COVID-19 vaccine to all of our patients. Cardiac medications (including blood thinners) should not deter anyone from being vaccinated and there is no need to hold any of those medications prior to vaccine administration.   Currently, there is a hotline to call (active 07/02/19) to schedule vaccination appointments as no walk-ins will be accepted.    Vaccines through the health department can be arranged by calling [phone]    Vaccines through [facility] can be arranged by calling [phone] or visiting  [email or web address]   If you have further questions or concerns about the vaccine process, please visit [email or web address], [email or web address], or contact your primary care physician.

## 2019-07-19 ENCOUNTER — Telehealth: Payer: Self-pay | Admitting: Interventional Cardiology

## 2019-07-19 NOTE — Telephone Encounter (Signed)
Patient returning call for lab results. 

## 2019-07-19 NOTE — Telephone Encounter (Signed)
The patient has been notified of the result and verbalized understanding.  All questions (if any) were answered. Antonieta Iba, RN 07/19/2019 9:21 AM

## 2019-07-28 DIAGNOSIS — F17211 Nicotine dependence, cigarettes, in remission: Secondary | ICD-10-CM | POA: Diagnosis not present

## 2019-07-28 DIAGNOSIS — Z79899 Other long term (current) drug therapy: Secondary | ICD-10-CM | POA: Diagnosis not present

## 2019-07-28 DIAGNOSIS — Z125 Encounter for screening for malignant neoplasm of prostate: Secondary | ICD-10-CM | POA: Diagnosis not present

## 2019-07-28 DIAGNOSIS — E785 Hyperlipidemia, unspecified: Secondary | ICD-10-CM | POA: Diagnosis not present

## 2019-07-28 DIAGNOSIS — I10 Essential (primary) hypertension: Secondary | ICD-10-CM | POA: Diagnosis not present

## 2019-07-28 DIAGNOSIS — E669 Obesity, unspecified: Secondary | ICD-10-CM | POA: Diagnosis not present

## 2019-07-28 DIAGNOSIS — H409 Unspecified glaucoma: Secondary | ICD-10-CM | POA: Diagnosis not present

## 2019-07-28 DIAGNOSIS — R7303 Prediabetes: Secondary | ICD-10-CM | POA: Diagnosis not present

## 2019-07-28 DIAGNOSIS — Z008 Encounter for other general examination: Secondary | ICD-10-CM | POA: Diagnosis not present

## 2019-07-28 DIAGNOSIS — Z1159 Encounter for screening for other viral diseases: Secondary | ICD-10-CM | POA: Diagnosis not present

## 2019-07-28 DIAGNOSIS — Z0000 Encounter for general adult medical examination without abnormal findings: Secondary | ICD-10-CM | POA: Diagnosis not present

## 2019-07-28 DIAGNOSIS — Z6834 Body mass index (BMI) 34.0-34.9, adult: Secondary | ICD-10-CM | POA: Diagnosis not present

## 2019-07-30 DIAGNOSIS — H2513 Age-related nuclear cataract, bilateral: Secondary | ICD-10-CM | POA: Diagnosis not present

## 2019-07-30 DIAGNOSIS — H43811 Vitreous degeneration, right eye: Secondary | ICD-10-CM | POA: Diagnosis not present

## 2019-07-30 DIAGNOSIS — H401131 Primary open-angle glaucoma, bilateral, mild stage: Secondary | ICD-10-CM | POA: Diagnosis not present

## 2019-07-30 DIAGNOSIS — R7309 Other abnormal glucose: Secondary | ICD-10-CM | POA: Diagnosis not present

## 2019-08-15 DIAGNOSIS — Z1211 Encounter for screening for malignant neoplasm of colon: Secondary | ICD-10-CM | POA: Diagnosis not present

## 2019-08-16 DIAGNOSIS — E785 Hyperlipidemia, unspecified: Secondary | ICD-10-CM | POA: Diagnosis not present

## 2019-08-16 DIAGNOSIS — I10 Essential (primary) hypertension: Secondary | ICD-10-CM | POA: Diagnosis not present

## 2019-08-16 DIAGNOSIS — Z1211 Encounter for screening for malignant neoplasm of colon: Secondary | ICD-10-CM | POA: Diagnosis not present

## 2019-08-16 DIAGNOSIS — H409 Unspecified glaucoma: Secondary | ICD-10-CM | POA: Diagnosis not present

## 2019-08-16 DIAGNOSIS — K429 Umbilical hernia without obstruction or gangrene: Secondary | ICD-10-CM | POA: Diagnosis not present

## 2019-08-16 DIAGNOSIS — Z0000 Encounter for general adult medical examination without abnormal findings: Secondary | ICD-10-CM | POA: Diagnosis not present

## 2019-08-16 DIAGNOSIS — Z0001 Encounter for general adult medical examination with abnormal findings: Secondary | ICD-10-CM | POA: Diagnosis not present

## 2019-08-16 DIAGNOSIS — J309 Allergic rhinitis, unspecified: Secondary | ICD-10-CM | POA: Diagnosis not present

## 2019-08-16 DIAGNOSIS — E669 Obesity, unspecified: Secondary | ICD-10-CM | POA: Diagnosis not present

## 2019-08-16 DIAGNOSIS — N529 Male erectile dysfunction, unspecified: Secondary | ICD-10-CM | POA: Diagnosis not present

## 2019-08-16 DIAGNOSIS — Z23 Encounter for immunization: Secondary | ICD-10-CM | POA: Diagnosis not present

## 2019-08-16 DIAGNOSIS — Z008 Encounter for other general examination: Secondary | ICD-10-CM | POA: Diagnosis not present

## 2019-08-23 ENCOUNTER — Other Ambulatory Visit: Payer: Self-pay

## 2019-08-23 MED ORDER — TADALAFIL 20 MG PO TABS: ORAL_TABLET | ORAL | 2 refills | Status: DC

## 2019-08-23 MED ORDER — OLMESARTAN MEDOXOMIL 40 MG PO TABS: 40.0000 mg | ORAL_TABLET | Freq: Every day | ORAL | 2 refills | Status: DC

## 2019-08-24 ENCOUNTER — Telehealth: Payer: Self-pay

## 2019-08-24 MED ORDER — SILDENAFIL CITRATE 20 MG PO TABS: ORAL_TABLET | ORAL | 3 refills | Status: DC

## 2019-08-24 NOTE — Telephone Encounter (Signed)
Will send to Dr. Irish Lack for recommendation on other ED med.

## 2019-08-24 NOTE — Telephone Encounter (Signed)
Pt states that he would like for Dr.Varanasi to write him a prescription for a different brand of Cialis due to the price of his current prescription being too high. Pt would like new medication sent to [facility] on [address]

## 2019-08-24 NOTE — Telephone Encounter (Signed)
OK to try generic sildenafil at [facility], 3-5 20 mg tabs 1 hour prior.   JV

## 2019-08-24 NOTE — Telephone Encounter (Signed)
Called and made patient aware that we will switch cialis to sildenafil 20 mg, taking 3-5 tablets prn 1 hour prior to sexual activity. Rx sent to preferred pharmacy.

## 2019-09-27 DIAGNOSIS — M79672 Pain in left foot: Secondary | ICD-10-CM | POA: Diagnosis not present

## 2019-09-27 DIAGNOSIS — E669 Obesity, unspecified: Secondary | ICD-10-CM | POA: Diagnosis not present

## 2019-09-27 DIAGNOSIS — Z6834 Body mass index (BMI) 34.0-34.9, adult: Secondary | ICD-10-CM | POA: Diagnosis not present

## 2019-09-27 DIAGNOSIS — Z008 Encounter for other general examination: Secondary | ICD-10-CM | POA: Diagnosis not present

## 2019-09-28 ENCOUNTER — Other Ambulatory Visit: Payer: Self-pay | Admitting: Family Medicine

## 2019-09-28 ENCOUNTER — Ambulatory Visit
Admission: RE | Admit: 2019-09-28 | Discharge: 2019-09-28 | Disposition: A | Discharge status: Home / Self Care | Payer: Medicare Other | Source: Ambulatory Visit | Attending: Family Medicine | Admitting: Family Medicine

## 2019-09-28 DIAGNOSIS — S99922A Unspecified injury of left foot, initial encounter: Secondary | ICD-10-CM | POA: Diagnosis not present

## 2019-09-28 DIAGNOSIS — T1490XA Injury, unspecified, initial encounter: Secondary | ICD-10-CM

## 2019-09-28 DIAGNOSIS — M7989 Other specified soft tissue disorders: Secondary | ICD-10-CM | POA: Diagnosis not present

## 2019-10-04 DIAGNOSIS — M79675 Pain in left toe(s): Secondary | ICD-10-CM | POA: Diagnosis not present

## 2019-10-18 DIAGNOSIS — M21612 Bunion of left foot: Secondary | ICD-10-CM | POA: Diagnosis not present

## 2019-10-18 DIAGNOSIS — M79671 Pain in right foot: Secondary | ICD-10-CM | POA: Diagnosis not present

## 2019-10-18 DIAGNOSIS — M79672 Pain in left foot: Secondary | ICD-10-CM | POA: Diagnosis not present

## 2019-10-19 ENCOUNTER — Telehealth: Payer: Self-pay | Admitting: *Deleted

## 2019-10-19 NOTE — Telephone Encounter (Signed)
   [facility] Pre-operative Risk Assessment    Request for surgical clearance:  1. What type of surgery is being performed? LEFT FOOT BUNIONECTOMY   2. When is this surgery scheduled? TBD   3. What type of clearance is required (medical clearance vs. Pharmacy clearance to hold med vs. Both)? MEDICAL  4. Are there any medications that need to be held prior to surgery and how long? ASA   5. Practice name and name of physician performing surgery? [facility]; DR. Jenny Reichmann [place]   6. What is your office phone number [phone]    7.   What is your office fax number [phone] ATTN; KELLY HANCOCK  8.   Anesthesia type (None, local, MAC, general) ? CHOICE   Benjamin Vega 10/19/2019, 8:58 AM  _________________________________________________________________   (provider comments below)

## 2019-10-20 NOTE — Telephone Encounter (Signed)
   Primary Cardiologist: Larae Grooms, MD  Chart reviewed and patient contacted by phone today as part of pre-operative protocol coverage. Given past medical history and time since last visit, based on ACC/AHA guidelines, ERICSSON SCIORTINO would be at acceptable risk for the planned procedure without further cardiovascular testing.   OK to hold aspirin 3-5 days pre op if needed.  I will route this recommendation to the requesting party via Epic fax function and remove from pre-op pool.  Please call with questions.  Kerin Ransom, PA-C 10/20/2019, 10:53 AM

## 2019-10-26 DIAGNOSIS — G8918 Other acute postprocedural pain: Secondary | ICD-10-CM | POA: Diagnosis not present

## 2019-10-26 DIAGNOSIS — M2012 Hallux valgus (acquired), left foot: Secondary | ICD-10-CM | POA: Diagnosis not present

## 2019-11-09 DIAGNOSIS — Z008 Encounter for other general examination: Secondary | ICD-10-CM | POA: Diagnosis not present

## 2019-11-09 DIAGNOSIS — Z9889 Other specified postprocedural states: Secondary | ICD-10-CM | POA: Diagnosis not present

## 2019-11-09 DIAGNOSIS — R7303 Prediabetes: Secondary | ICD-10-CM | POA: Diagnosis not present

## 2019-11-09 DIAGNOSIS — I10 Essential (primary) hypertension: Secondary | ICD-10-CM | POA: Diagnosis not present

## 2019-11-09 DIAGNOSIS — F17211 Nicotine dependence, cigarettes, in remission: Secondary | ICD-10-CM | POA: Diagnosis not present

## 2019-11-09 DIAGNOSIS — Z79899 Other long term (current) drug therapy: Secondary | ICD-10-CM | POA: Diagnosis not present

## 2019-12-03 ENCOUNTER — Telehealth: Payer: Self-pay

## 2019-12-03 NOTE — Telephone Encounter (Signed)
NOTES ON FILE FROM [facility] [phone], SENT REFERRAL TO SCHEDULING 

## 2019-12-08 ENCOUNTER — Other Ambulatory Visit (HOSPITAL_COMMUNITY): Payer: Self-pay | Admitting: Family Medicine

## 2019-12-08 DIAGNOSIS — I10 Essential (primary) hypertension: Secondary | ICD-10-CM

## 2019-12-08 DIAGNOSIS — Z87891 Personal history of nicotine dependence: Secondary | ICD-10-CM

## 2019-12-08 DIAGNOSIS — Z136 Encounter for screening for cardiovascular disorders: Secondary | ICD-10-CM

## 2019-12-09 ENCOUNTER — Ambulatory Visit (HOSPITAL_COMMUNITY)
Admission: RE | Admit: 2019-12-09 | Discharge: 2019-12-09 | Disposition: A | Discharge status: Home / Self Care | Payer: Medicare Other | Source: Ambulatory Visit | Attending: Cardiology | Admitting: Cardiology

## 2019-12-09 ENCOUNTER — Other Ambulatory Visit: Payer: Self-pay

## 2019-12-09 DIAGNOSIS — Z87891 Personal history of nicotine dependence: Secondary | ICD-10-CM | POA: Insufficient documentation

## 2019-12-09 DIAGNOSIS — Z136 Encounter for screening for cardiovascular disorders: Secondary | ICD-10-CM | POA: Insufficient documentation

## 2020-01-20 DIAGNOSIS — M79672 Pain in left foot: Secondary | ICD-10-CM | POA: Diagnosis not present

## 2020-01-20 DIAGNOSIS — M21612 Bunion of left foot: Secondary | ICD-10-CM | POA: Diagnosis not present

## 2020-01-20 DIAGNOSIS — Z4889 Encounter for other specified surgical aftercare: Secondary | ICD-10-CM | POA: Diagnosis not present

## 2020-01-27 DIAGNOSIS — H401131 Primary open-angle glaucoma, bilateral, mild stage: Secondary | ICD-10-CM | POA: Diagnosis not present

## 2020-01-27 DIAGNOSIS — H43811 Vitreous degeneration, right eye: Secondary | ICD-10-CM | POA: Diagnosis not present

## 2020-02-08 DIAGNOSIS — Z008 Encounter for other general examination: Secondary | ICD-10-CM | POA: Diagnosis not present

## 2020-02-08 DIAGNOSIS — F17211 Nicotine dependence, cigarettes, in remission: Secondary | ICD-10-CM | POA: Diagnosis not present

## 2020-02-08 DIAGNOSIS — I10 Essential (primary) hypertension: Secondary | ICD-10-CM | POA: Diagnosis not present

## 2020-02-08 DIAGNOSIS — Z9889 Other specified postprocedural states: Secondary | ICD-10-CM | POA: Diagnosis not present

## 2020-02-08 DIAGNOSIS — Z6835 Body mass index (BMI) 35.0-35.9, adult: Secondary | ICD-10-CM | POA: Diagnosis not present

## 2020-04-04 ENCOUNTER — Ambulatory Visit: Payer: Medicare Other | Attending: Critical Care Medicine

## 2020-04-04 DIAGNOSIS — Z23 Encounter for immunization: Secondary | ICD-10-CM

## 2020-04-04 NOTE — Progress Notes (Signed)
   Covid-19 Vaccination Clinic  Name:  NAKAI POLLIO    MRN: [id number] DOB: April 15, 1951  04/04/2020  Mr. Dirocco was observed post Covid-19 immunization for 15 minutes without incident. He was provided with Vaccine Information Sheet and instruction to access the V-Safe system.   Mr. Mahoney was instructed to call 911 with any severe reactions post vaccine: Marland Kitchen Difficulty breathing  . Swelling of face and throat  . A fast heartbeat  . A bad rash all over body  . Dizziness and weakness

## 2020-04-07 DIAGNOSIS — Z23 Encounter for immunization: Secondary | ICD-10-CM | POA: Diagnosis not present

## 2020-05-17 ENCOUNTER — Other Ambulatory Visit: Payer: Self-pay | Admitting: Interventional Cardiology

## 2020-06-06 DIAGNOSIS — Z0000 Encounter for general adult medical examination without abnormal findings: Secondary | ICD-10-CM | POA: Diagnosis not present

## 2020-06-06 DIAGNOSIS — R143 Flatulence: Secondary | ICD-10-CM | POA: Diagnosis not present

## 2020-06-06 DIAGNOSIS — Z6834 Body mass index (BMI) 34.0-34.9, adult: Secondary | ICD-10-CM | POA: Diagnosis not present

## 2020-06-06 DIAGNOSIS — I10 Essential (primary) hypertension: Secondary | ICD-10-CM | POA: Diagnosis not present

## 2020-06-06 DIAGNOSIS — E669 Obesity, unspecified: Secondary | ICD-10-CM | POA: Diagnosis not present

## 2020-06-06 DIAGNOSIS — Z008 Encounter for other general examination: Secondary | ICD-10-CM | POA: Diagnosis not present

## 2020-06-26 ENCOUNTER — Other Ambulatory Visit: Payer: Self-pay | Admitting: Interventional Cardiology

## 2020-07-16 NOTE — Progress Notes (Signed)
Cardiology Office Note   Date:  07/17/2020   ID:  Benjamin Vega, Benjamin Vega, MRN [id number]  PCP:  Andree Moro, DO    No chief complaint on file.  HTN  Wt Readings from Last 3 Encounters:  07/17/20 228 lb 9.6 oz (103.7 kg)  07/15/19 219 lb (99.3 kg)  07/09/18 214 lb 3.2 oz (97.2 kg)       History of Present Illness: Benjamin Vega is a 70 y.o. male  with history of hypertension, life screening done in 2016&2017 normal with no AAA normal ABIs and normal carotids. Also has hyperlipidemia, erectile dysfunction.  He has had chronic hip pain which limited walking, in the past. THis is due to arthritis. He had DOE when he took steroids for the arthritis.  BP has typically higher in the MDs office.  Felt some tachycardia in the past which he thinks this was due to steroids. Improved with carvedilol.   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 He had a normal stress test in August 2019. He had normal ejection fraction in August 2019 by echocardiogram as well.   Had foot surgery in 2021 to remove a bunion.  THis went well.  Limited his walking for a short time.   2021 screening u/s showed aortic size of 2.9 cm.    He has had all of his COVID vaccinations.   Past Medical History:  Diagnosis Date  . Edema   . Erectile dysfunction   . HTN (hypertension)   . Hypercholesteremia   . Hyperlipemia     Past Surgical History:  Procedure Laterality Date  . COLONOSCOPY       Current Outpatient Medications  Medication Sig Dispense Refill  . amLODipine (NORVASC) 10 MG tablet TAKE 1 TABLET DAILY 90 tablet 3  . aspirin EC 81 MG tablet Take 1 tablet (81 mg total) by mouth daily. 90 tablet 3  . atorvastatin (LIPITOR) 10 MG tablet Take 1 tablet (10 mg total) by mouth daily. 90 tablet 3  . carvedilol (COREG) 12.5 MG tablet TAKE 1 TABLET TWICE A DAY 180 tablet 3  . COMBIGAN 0.2-0.5 % ophthalmic solution Place 1 drop into  both eyes every 12 (twelve) hours.     Marland Kitchen olmesartan (BENICAR) 40 MG tablet TAKE 1 TABLET BY MOUTH EVERY DAY 90 tablet 3  . sildenafil (REVATIO) 20 MG tablet Take 3-5 tablets by mouth AS NEEDED 1 hour prior to sexual activity 30 tablet 3  . spironolactone (ALDACTONE) 25 MG tablet TAKE 1 TABLET DAILY 90 tablet 3   No current facility-administered medications for this visit.    Allergies:   Patient has no known allergies.    Social History:  The patient  reports that he has quit smoking. He has quit using smokeless tobacco.  His smokeless tobacco use included chew. He reports current alcohol use of about 4.0 standard drinks of alcohol per week. He reports that he does not use drugs.   Family History:  The patient's family history includes Diabetes in his mother; Esophageal cancer in his sister; Heart disease in his father; Hypertension in his father.    ROS:  Please see the history of present illness.   Otherwise, review of systems are positive for foot pain- improved.   All other systems are reviewed and negative.    PHYSICAL EXAM: VS:  BP 108/74   Pulse 78   Ht 5\' 7"  (1.702 m)   Wt  228 lb 9.6 oz (103.7 kg)   SpO2 94%   BMI 35.80 kg/m  , BMI Body mass index is 35.8 kg/m. GEN: Well nourished, well developed, in no acute distress  HEENT: normal  Neck: no JVD, carotid bruits, or masses Cardiac: RRR; no murmurs, rubs, or gallops,no edema  Respiratory:  clear to auscultation bilaterally, normal work of breathing GI: soft, nontender, nondistended, + BS, mild obesity; small umbilical hernia MS: no deformity or atrophy  Skin: warm and dry, no rash Neuro:  Strength and sensation are intact Psych: euthymic mood, full affect   EKG:   The ekg ordered today demonstrates NSR, nonspecific ST changes   Recent Labs: No results found for requested labs within last 8760 hours.   Lipid Panel    Component Value Date/Time   CHOL 145 07/15/2019 0856   TRIG 112 07/15/2019 0856   HDL 52  07/15/2019 0856   CHOLHDL 2.8 07/15/2019 0856   CHOLHDL 2.9 05/10/2016 0845   VLDL 18 05/10/2016 0845   LDLCALC 73 07/15/2019 0856     Other studies Reviewed: Additional studies/ records that were reviewed today with results demonstrating: 2021 labs reviewed.   ASSESSMENT AND PLAN:  1.  HTN: low salt diet.  Home readings reviewed and well controlled.  The current medical regimen is effective;  continue present plan and medications.  Continue regular exercise. 2. Hyperlipidemia: Whole food, plant based diet. Needs lipids checked. LDL controlled in 2021.  3. Morbid obesity: Has gained weight.  We spoke about increasing fiber intake.  Avoid processed foods.  Discussed intermittent fasting as well.  He already skips eating between 7PM - 9AM. 4. Erectile dysfunction:  20 mg sildenafil is working for him.  Not using any nitrates.   Current medicines are reviewed at length with the patient today.  The patient concerns regarding his medicines were addressed.  The following changes have been made:  No change  Labs/ tests ordered today include:  No orders of the defined types were placed in this encounter.   Recommend 150 minutes/week of aerobic exercise Low fat, low carb, high fiber diet recommended  Disposition:   FU in 1 year   Signed, Larae Grooms, MD  07/17/2020 8:07 AM    [facility] [address], [place], [place]  [address] Phone: [phone]; Fax: [phone]

## 2020-07-17 ENCOUNTER — Ambulatory Visit (INDEPENDENT_AMBULATORY_CARE_PROVIDER_SITE_OTHER): Payer: Medicare Other | Admitting: Interventional Cardiology

## 2020-07-17 ENCOUNTER — Encounter: Payer: Self-pay | Admitting: Interventional Cardiology

## 2020-07-17 ENCOUNTER — Other Ambulatory Visit: Payer: Self-pay

## 2020-07-17 VITALS — BP 108/74 | HR 78 | Ht 67.0 in | Wt 228.6 lb

## 2020-07-17 DIAGNOSIS — E669 Obesity, unspecified: Secondary | ICD-10-CM

## 2020-07-17 DIAGNOSIS — E782 Mixed hyperlipidemia: Secondary | ICD-10-CM | POA: Diagnosis not present

## 2020-07-17 DIAGNOSIS — N529 Male erectile dysfunction, unspecified: Secondary | ICD-10-CM

## 2020-07-17 DIAGNOSIS — I10 Essential (primary) hypertension: Secondary | ICD-10-CM | POA: Diagnosis not present

## 2020-07-17 LAB — COMPREHENSIVE METABOLIC PANEL
ALT: 25 IU/L (ref 0–44)
AST: 23 IU/L (ref 0–40)
Albumin/Globulin Ratio: 1.7 (ref 1.2–2.2)
Albumin: 4.5 g/dL (ref 3.7–4.7)
Alkaline Phosphatase: 76 IU/L (ref 44–121)
BUN/Creatinine Ratio: 23 (ref 10–24)
BUN: 32 mg/dL — ABNORMAL HIGH (ref 8–27)
Bilirubin Total: 0.3 mg/dL (ref 0.0–1.2)
CO2: 22 mmol/L (ref 20–29)
Calcium: 9.7 mg/dL (ref 8.6–10.2)
Chloride: 104 mmol/L (ref 96–106)
Creatinine, Ser: 1.41 mg/dL — ABNORMAL HIGH (ref 0.76–1.27)
GFR calc Af Amer: 58 mL/min/{1.73_m2} — ABNORMAL LOW (ref >59)
GFR calc non Af Amer: 50 mL/min/{1.73_m2} — ABNORMAL LOW (ref >59)
Globulin, Total: 2.7 g/dL (ref 1.5–4.5)
Glucose: 123 mg/dL — ABNORMAL HIGH (ref 65–99)
Potassium: 4.6 mmol/L (ref 3.5–5.2)
Sodium: 140 mmol/L (ref 134–144)
Total Protein: 7.2 g/dL (ref 6.0–8.5)

## 2020-07-17 LAB — LIPID PANEL
Chol/HDL Ratio: 3.1 ratio (ref 0.0–5.0)
Cholesterol, Total: 149 mg/dL (ref 100–199)
HDL: 48 mg/dL (ref >39)
LDL Chol Calc (NIH): 83 mg/dL (ref 0–99)
Triglycerides: 97 mg/dL (ref 0–149)
VLDL Cholesterol Cal: 18 mg/dL (ref 5–40)

## 2020-07-17 LAB — CBC
Hematocrit: 40.1 % (ref 37.5–51.0)
Hemoglobin: 13.6 g/dL (ref 13.0–17.7)
MCH: 30.8 pg (ref 26.6–33.0)
MCHC: 33.9 g/dL (ref 31.5–35.7)
MCV: 91 fL (ref 79–97)
Platelets: 197 10*3/uL (ref 150–450)
RBC: 4.41 x10E6/uL (ref 4.14–5.80)
RDW: 14.4 % (ref 11.6–15.4)
WBC: 5.4 10*3/uL (ref 3.4–10.8)

## 2020-07-17 MED ORDER — OLMESARTAN MEDOXOMIL 40 MG PO TABS: 40.0000 mg | ORAL_TABLET | Freq: Every day | ORAL | 3 refills | Status: DC

## 2020-07-17 MED ORDER — ATORVASTATIN CALCIUM 10 MG PO TABS: 10.0000 mg | ORAL_TABLET | Freq: Every day | ORAL | 3 refills | Status: DC

## 2020-07-17 NOTE — Patient Instructions (Signed)
Medication Instructions:  Your physician recommends that you continue on your current medications as directed. Please refer to the Current Medication list given to you today.   *If you need a refill on your cardiac medications before your next appointment, please call your pharmacy*   Lab Work: TODAY: CBC, Complete metabolic panel, Lipid panel If you have labs (blood work) drawn today and your tests are completely normal, you will receive your results only by: Marland Kitchen MyChart Message (if you have MyChart) OR . A paper copy in the mail If you have any lab test that is abnormal or we need to change your treatment, we will call you to review the results.   Testing/Procedures: NONE   Follow-Up: At [facility], you and your health needs are our priority.  As part of our continuing mission to provide you with exceptional heart care, we have created designated Provider Care Teams.  These Care Teams include your primary Cardiologist (physician) and Advanced Practice Providers (APPs -  Physician Assistants and Nurse Practitioners) who all work together to provide you with the care you need, when you need it.  We recommend signing up for the patient portal called "MyChart".  Sign up information is provided on this After Visit Summary.  MyChart is used to connect with patients for Virtual Visits (Telemedicine).  Patients are able to view lab/test results, encounter notes, upcoming appointments, etc.  Non-urgent messages can be sent to your provider as well.   To learn more about what you can do with MyChart, go to [email or web address].    Your next appointment:   1 year(s)  The format for your next appointment:   In Person  Provider:   You may see Larae Grooms, MD or one of the following Advanced Practice Providers on your designated Care Team:    Melina Copa, PA-C  Ermalinda Barrios, PA-C    Other Instructions  High-Fiber Eating Plan Fiber, also called dietary fiber, is a type of  carbohydrate. It is found foods such as fruits, vegetables, whole grains, and beans. A high-fiber diet can have many health benefits. Your health care provider may recommend a high-fiber diet to help:  Prevent constipation. Fiber can make your bowel movements more regular.  Lower your cholesterol.  Relieve the following conditions: ? Inflammation of veins in the anus (hemorrhoids). ? Inflammation of specific areas of the digestive tract (uncomplicated diverticulosis). ? A problem of the large intestine, also called the colon, that sometimes causes pain and diarrhea (irritable bowel syndrome, or IBS).  Prevent overeating as part of a weight-loss plan.  Prevent heart disease, type 2 diabetes, and certain cancers. What are tips for following this plan? Reading food labels  Check the nutrition facts label on food products for the amount of dietary fiber. Choose foods that have 5 grams of fiber or more per serving.  The goals for recommended daily fiber intake include: ? Men (age 30 or younger): 34-38 g. ? Men (over age 20): 28-34 g. ? Women (age 21 or younger): 25-28 g. ? Women (over age 56): 22-25 g. Your daily fiber goal is _____________ g.   Shopping  Choose whole fruits and vegetables instead of processed forms, such as apple juice or applesauce.  Choose a wide variety of high-fiber foods such as avocados, lentils, oats, and kidney beans.  Read the nutrition facts label of the foods you choose. Be aware of foods with added fiber. These foods often have high sugar and sodium amounts per serving. Cooking  Use  whole-grain flour for baking and cooking.  Cook with brown rice instead of white rice. Meal planning  Start the day with a breakfast that is high in fiber, such as a cereal that contains 5 g of fiber or more per serving.  Eat breads and cereals that are made with whole-grain flour instead of refined flour or white flour.  Eat brown rice, bulgur wheat, or millet instead  of white rice.  Use beans in place of meat in soups, salads, and pasta dishes.  Be sure that half of the grains you eat each day are whole grains. General information  You can get the recommended daily intake of dietary fiber by: ? Eating a variety of fruits, vegetables, grains, nuts, and beans. ? Taking a fiber supplement if you are not able to take in enough fiber in your diet. It is better to get fiber through food than from a supplement.  Gradually increase how much fiber you consume. If you increase your intake of dietary fiber too quickly, you may have bloating, cramping, or gas.  Drink plenty of water to help you digest fiber.  Choose high-fiber snacks, such as berries, raw vegetables, nuts, and popcorn. What foods should I eat? Fruits Berries. Pears. Apples. Oranges. Avocado. Prunes and raisins. Dried figs. Vegetables Sweet potatoes. Spinach. Kale. Artichokes. Cabbage. Broccoli. Cauliflower. Green peas. Carrots. Squash. Grains Whole-grain breads. Multigrain cereal. Oats and oatmeal. Brown rice. Barley. Bulgur wheat. [place]. Quinoa. Bran muffins. Popcorn. Rye wafer crackers. Meats and other proteins Navy beans, kidney beans, and pinto beans. Soybeans. Split peas. Lentils. Nuts and seeds. Dairy Fiber-fortified yogurt. Beverages Fiber-fortified soy milk. Fiber-fortified orange juice. Other foods Fiber bars. The items listed above may not be a complete list of recommended foods and beverages. Contact a dietitian for more information. What foods should I avoid? Fruits Fruit juice. Cooked, strained fruit. Vegetables Fried potatoes. Canned vegetables. Well-cooked vegetables. Grains White bread. Pasta made with refined flour. White rice. Meats and other proteins Fatty cuts of meat. Fried chicken or fried fish. Dairy Milk. Yogurt. Cream cheese. Sour cream. Fats and oils Butters. Beverages Soft drinks. Other foods Cakes and pastries. The items listed above may not be a  complete list of foods and beverages to avoid. Talk with your dietitian about what choices are best for you. Summary  Fiber is a type of carbohydrate. It is found in foods such as fruits, vegetables, whole grains, and beans.  A high-fiber diet has many benefits. It can help to prevent constipation, lower blood cholesterol, aid weight loss, and reduce your risk of heart disease, diabetes, and certain cancers.  Increase your intake of fiber gradually. Increasing fiber too quickly may cause cramping, bloating, and gas. Drink plenty of water while you increase the amount of fiber you consume.  The best sources of fiber include whole fruits and vegetables, whole grains, nuts, seeds, and beans. This information is not intended to replace advice given to you by your health care provider. Make sure you discuss any questions you have with your health care provider. Document Revised: 10/14/2019 Document Reviewed: 10/14/2019 Elsevier Patient Education  2021 [employer].

## 2020-07-17 NOTE — Addendum Note (Signed)
Addended by: Precious Gilding on: 07/17/2020 08:53 AM   Modules accepted: Orders

## 2020-07-24 ENCOUNTER — Other Ambulatory Visit: Payer: Self-pay | Admitting: Interventional Cardiology

## 2021-03-27 ENCOUNTER — Other Ambulatory Visit: Payer: Self-pay | Admitting: Interventional Cardiology

## 2021-03-30 ENCOUNTER — Other Ambulatory Visit: Payer: Self-pay

## 2021-03-30 MED ORDER — SILDENAFIL CITRATE 20 MG PO TABS: ORAL_TABLET | ORAL | 3 refills | Status: DC

## 2021-03-30 NOTE — Telephone Encounter (Signed)
OK to refill

## 2021-03-30 NOTE — Telephone Encounter (Signed)
Pt's pharmacy is requesting a refill on sildenafil. Would Dr. Varanasi like to refill this medication? Please address 

## 2021-05-02 ENCOUNTER — Other Ambulatory Visit: Payer: Self-pay | Admitting: Interventional Cardiology

## 2021-06-09 IMAGING — DX DG FOOT COMPLETE 3+V*L*
3 series · 3 of 3 positions shown · non-contrast
Comparison: No recent prior.

CLINICAL DATA: Left foot injury.

EXAM:
LEFT FOOT - COMPLETE 3+ VIEW

[dg foot complete left (1 of 3)]
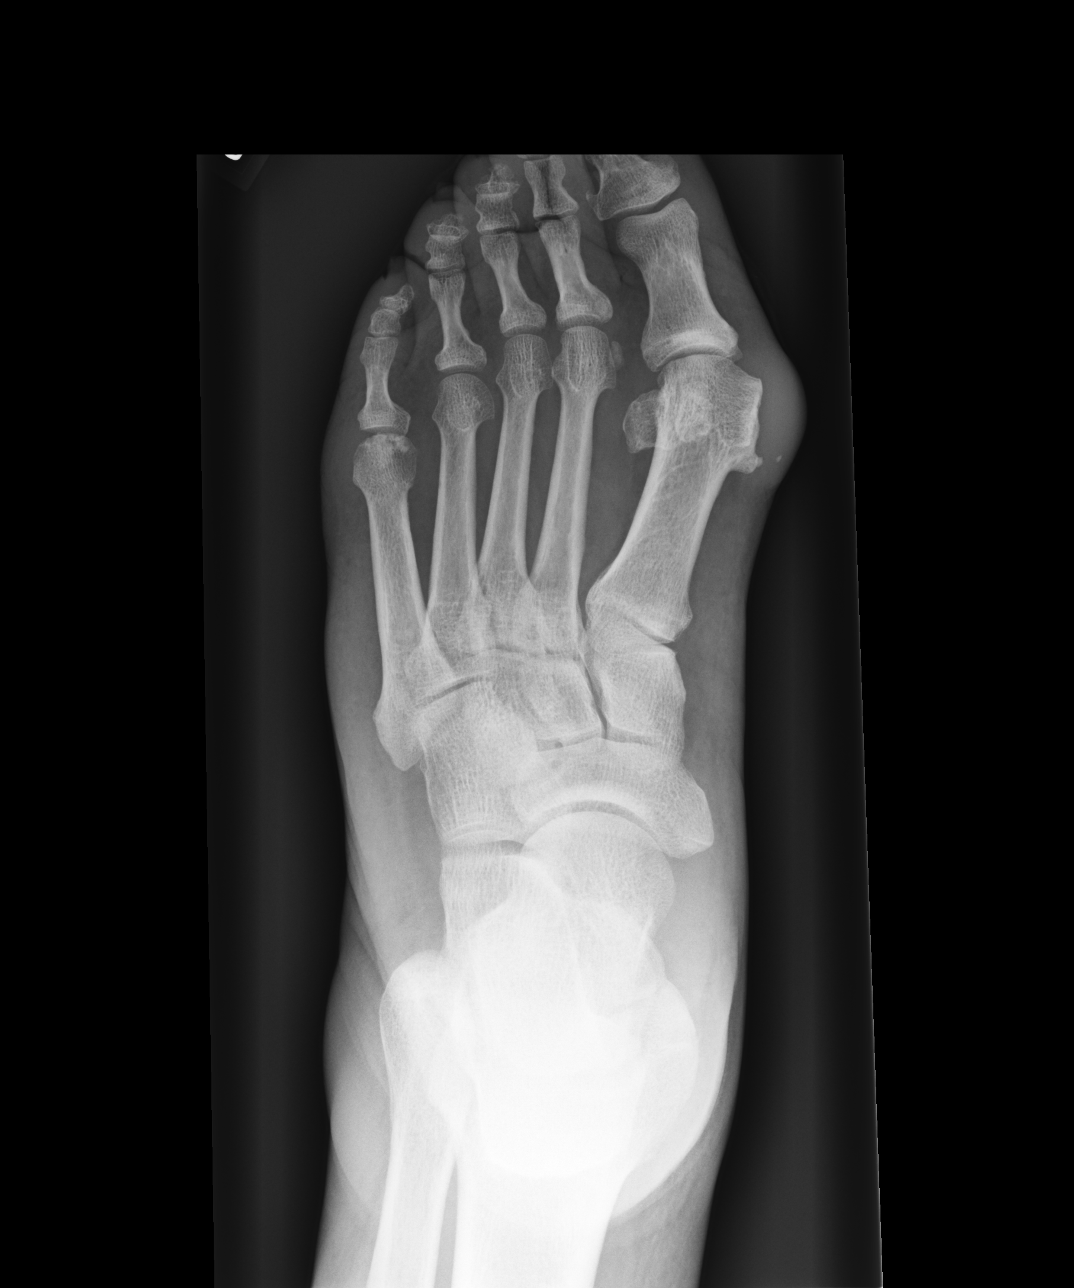

[dg foot complete left (2 of 3)]
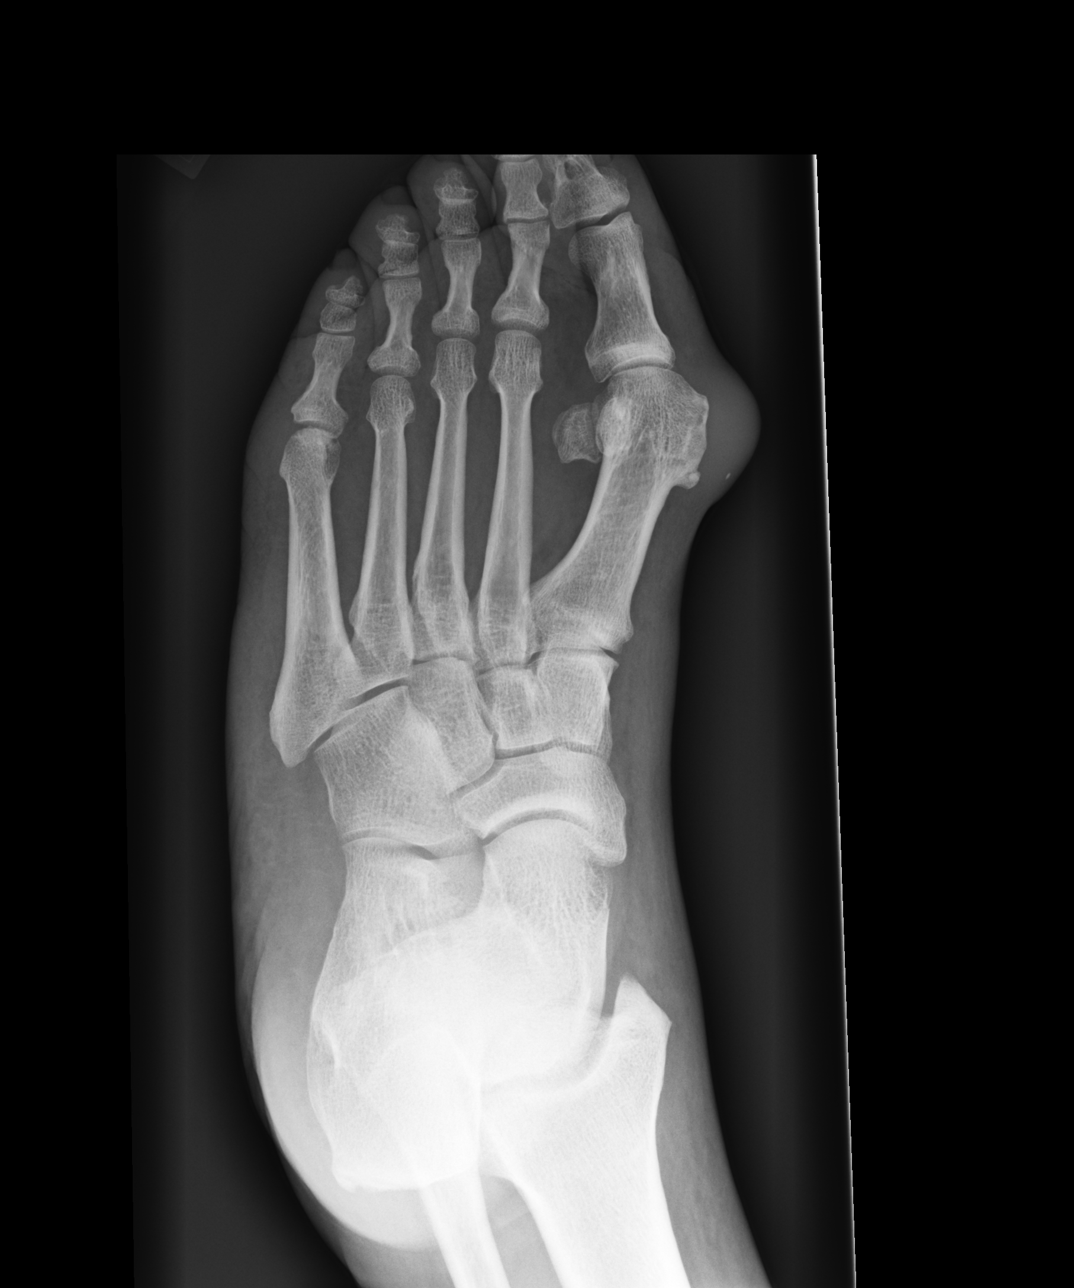

[dg foot complete left (3 of 3)]
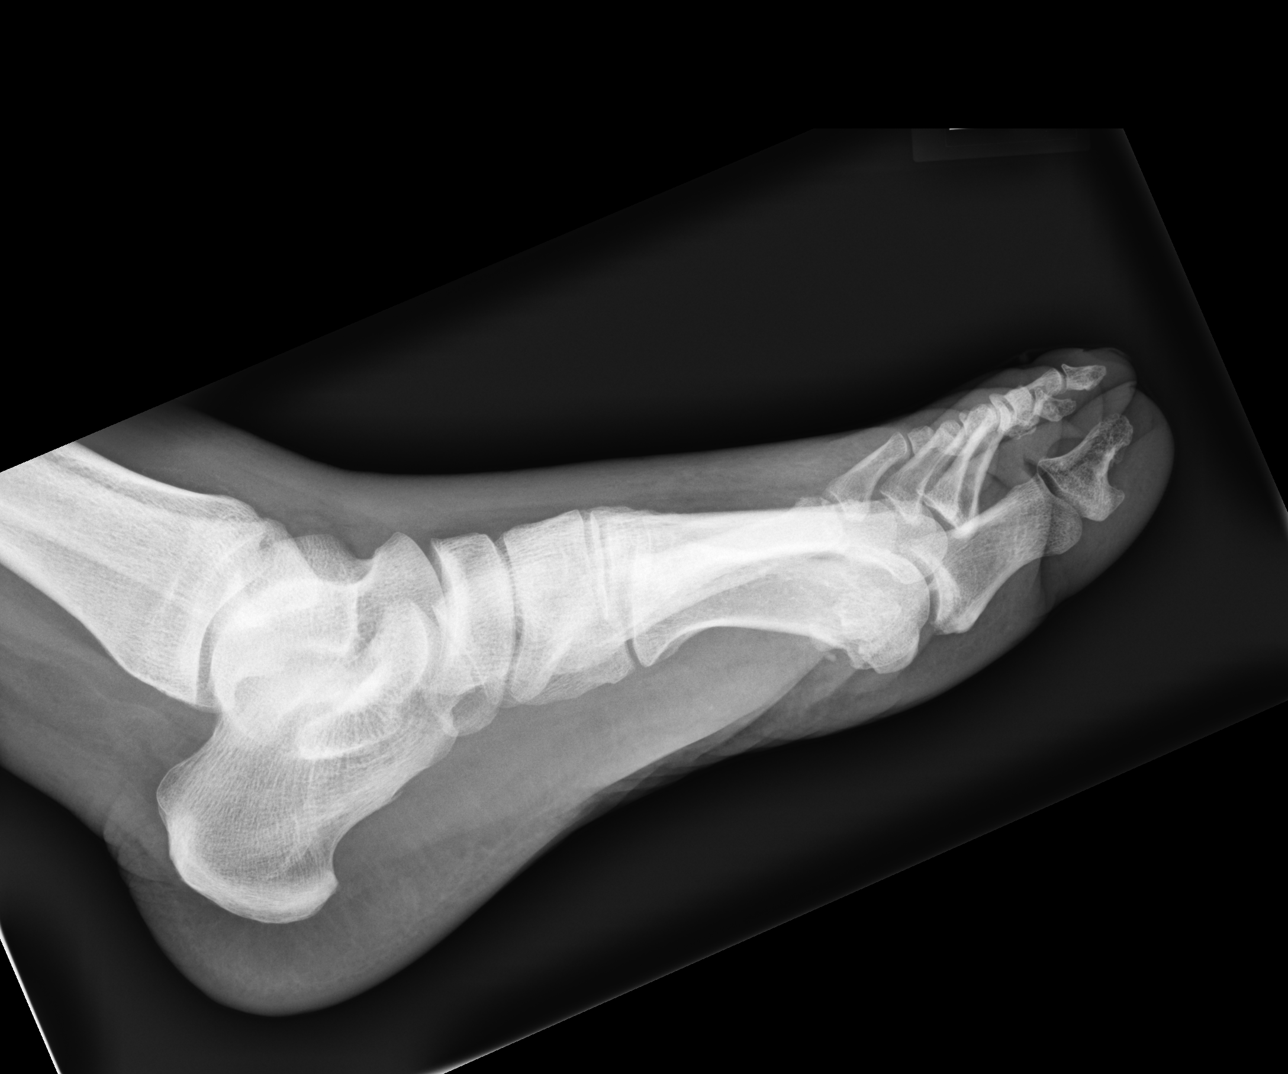

[3 of 3 positions shown; findings below may reference images not displayed]

FINDINGS: Soft tissue swelling noted adjacent to the first MTP joint. Tiny
calcific density noted within the soft tissue swelling. This could
represent tiny fracture fragment. Dystrophic calcification cannot be
excluded. Although gout cannot be excluded, no adjacent bony erosive
changes are noted. Degenerative changes first MTP joint. Mild
sclerotic changes noted about the distal head of the left fifth
metatarsal suggesting avascular necrosis. No other focal bony
abnormality identified.
IMPRESSION: 1. Soft tissue swelling noted adjacent to the first MTP joint. Tiny
calcific density noted within the soft tissue swelling. This could
represent a tiny fracture fragment. Dystrophic calcification cannot
be excluded. Although gout cannot be excluded, no adjacent bony
erosive changes are noted. Degenerative changes first MTP joint.

2. Mild sclerotic changes noted about the distal head of the left
fifth metatarsal suggesting avascular necrosis.

## 2021-06-14 ENCOUNTER — Other Ambulatory Visit: Payer: Self-pay | Admitting: Interventional Cardiology

## 2021-07-04 ENCOUNTER — Other Ambulatory Visit: Payer: Self-pay | Admitting: Interventional Cardiology

## 2021-07-04 MED ORDER — OLMESARTAN MEDOXOMIL 40 MG PO TABS: ORAL_TABLET | ORAL | 0 refills | Status: DC

## 2021-07-04 NOTE — Addendum Note (Signed)
Addended by: Carter Kitten D on: 07/04/2021 02:26 PM   Modules accepted: Orders

## 2021-07-22 NOTE — Progress Notes (Signed)
Office Visit    Patient Name: Benjamin Vega Date of Encounter: 07/23/2021  PCP:  Roselee Nova, MD   [place]  Cardiologist:  Larae Grooms, MD  Advanced Practice Provider:  No care team member to display Electrophysiologist:  None   HPI    Benjamin Vega is a 71 y.o. male with a hx of hypertension, hyperlipidemia, erectile dysfunction, and history of screening in 2016 and 2017 with no AAA, normal ABIs, and no carotid disease who presents today for annual follow-up visit.  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  He had a normal stress test in August 2019 with normal ejection fraction by echocardiogram also performed in August.  He had foot surgery in 2021 and this limited his walking for short time.  Screening ultrasound showed aortic size of 2.9 cm.  He was last seen in January 2022 by Dr. Irish Lack.  At that time we encouraged a low-sodium diet and regular exercise.  We encouraged weight loss since he had gained some weight.  We also had recommended annual lipid panel for cholesterol control.  Today, he feels good without any cardiovascular complaints.  He has been walking about 2 miles a day.  He was doing 5 miles a day however he has arthritis now and his back and hip which prevents him from doing as much.  He states he has been trying to maintain a low-sodium, heart healthy diet but his wife is such a good cook.  I took a look at his blood pressure log from over the last few months and overall his blood pressures have been in a good range.  I would not recommend any medication changes at this time.  He denies any chest pain or shortness of breath.  He has not had any dizziness or lightheadedness, syncope or presyncope.  He is excited to be celebrating his 50th wedding anniversary this summer.  Reports no shortness of breath nor dyspnea on exertion. Reports no chest pain, pressure, or tightness. No edema, orthopnea, PND. Reports no  palpitations.    Past Medical History    Past Medical History:  Diagnosis Date   Edema    Erectile dysfunction    HTN (hypertension)    Hypercholesteremia    Hyperlipemia    Past Surgical History:  Procedure Laterality Date   COLONOSCOPY      Allergies  No Known Allergies   EKGs/Labs/Other Studies Reviewed:   The following studies were reviewed today:  12/09/2019 vascular ultrasound, aorta  Summary:  Abdominal Aorta: Mild aortic atherosclerosis without stenosis. No evidence  of an abdominal aortic aneurysm was visualized. The largest aortic  measurement is 2.9 cm. The largest aortic measurement appears essentially  unchanged when compared to the prior  exam.    Mild tortuousity of the bilateral common and external iliac arteries. The  bilateral common and external iliac arteries are widely patent without  evidence of stenosis.   IVC/Iliac: Patent IVC.   Nuclear stress test 02/04/2018   Nuclear stress EF: 69%. Normal wall motion. There was no ST segment deviation noted during stress. There were no perfusion defects identified. This is a low risk study.   Candee Furbish, MD  02/04/2018 Echocardiogram  Study Conclusions   - Left ventricle: The cavity size was normal. There was mild focal    basal hypertrophy of the septum. Systolic function was normal.    The estimated ejection fraction was in the range  of 60% to 65%.    Wall motion was normal; there were no regional wall motion    abnormalities. Doppler parameters are consistent with abnormal    left ventricular relaxation (grade 1 diastolic dysfunction).   EKG:  EKG is  ordered today.  The ekg ordered today demonstrates NSR rate 71 bpm  Recent Labs: No results found for requested labs within last 8760 hours.  Recent Lipid Panel    Component Value Date/Time   CHOL 149 07/17/2020 0832   TRIG 97 07/17/2020 0832   HDL 48 07/17/2020 0832   CHOLHDL 3.1 07/17/2020 0832   CHOLHDL 2.9 05/10/2016 0845   VLDL 18  05/10/2016 0845   LDLCALC 83 07/17/2020 0832     Home Medications   Current Meds  Medication Sig   amLODipine (NORVASC) 10 MG tablet Take 1 tablet (10 mg total) by mouth daily. Please keep upcoming appt with Cardiologist in Jan. In order to receive future refills. Thank you.   aspirin EC 81 MG tablet Take 1 tablet (81 mg total) by mouth daily.   atorvastatin (LIPITOR) 10 MG tablet TAKE 1 TABLET DAILY   carvedilol (COREG) 12.5 MG tablet Take 1 tablet (12.5 mg total) by mouth 2 (two) times daily. Please keep upcoming appt with Cardiologist in Jan. In order to receive future refills. Thank you.   COMBIGAN 0.2-0.5 % ophthalmic solution Place 1 drop into both eyes every 12 (twelve) hours.    olmesartan (BENICAR) 40 MG tablet Take 1 tablet by mouth daily Please keep 07/23/21 appt with Richardson Dopp, PA for additional refills   sildenafil (REVATIO) 20 MG tablet Take 3-5 tablets by mouth AS NEEDED 1 hour prior to sexual activity   spironolactone (ALDACTONE) 25 MG tablet Take 1 tablet (25 mg total) by mouth daily. Please keep upcoming appt with Cardiologist in Jan. In order to receive future refills. Thank you.   timolol (TIMOPTIC) 0.5 % ophthalmic solution 1 drop 2 (two) times daily.     Review of Systems      All other systems reviewed and are otherwise negative except as noted above.  Physical Exam    VS:  BP 132/64 (BP Location: Left Arm)    Pulse 71    Ht 5\' 7"  (1.702 m)    Wt 226 lb (102.5 kg)    SpO2 96%    BMI 35.40 kg/m  , BMI Body mass index is 35.4 kg/m.  Wt Readings from Last 3 Encounters:  07/23/21 226 lb (102.5 kg)  07/17/20 228 lb 9.6 oz (103.7 kg)  07/15/19 219 lb (99.3 kg)     GEN: Well nourished, well developed, in no acute distress. HEENT: normal. Neck: Supple, no JVD, carotid bruits, or masses. Cardiac: RRR, no murmurs, rubs, or gallops. No clubbing, cyanosis, edema.  Radials/PT 2+ and equal bilaterally.  Respiratory:  Respirations regular and unlabored, clear to  auscultation bilaterally. GI: Soft, nontender, nondistended. MS: No deformity or atrophy. Skin: Warm and dry, no rash. Neuro:  Strength and sensation are intact. Psych: Normal affect.  Assessment & Plan    Hypertension -continue to log BP daily  -The log today that he brought in showed his BP from the last few months showed SBP 120-140 -Continue low-sodium, heart healthy diet  Hyperlipidemia -Continue statin therapy, tolerating well -Lipid panel and LFTs today.   Morbid obesity -discussed lifestyle changes -Continue daily exercise -Continue low-sodium heart healthy diet -his exercise is somewhat limited due to his orthopedic issues however he still is able to  walk about 2 miles a day  Erectile dysfunction -continue current medication regimen    Disposition: Follow up 1 year with Larae Grooms, MD or APP.  Signed, Elgie Collard, PA-C 07/23/2021, 9:21 AM [place]

## 2021-07-23 ENCOUNTER — Encounter: Payer: Self-pay | Admitting: Physician Assistant

## 2021-07-23 ENCOUNTER — Ambulatory Visit (INDEPENDENT_AMBULATORY_CARE_PROVIDER_SITE_OTHER): Payer: Medicare Other | Admitting: Physician Assistant

## 2021-07-23 ENCOUNTER — Other Ambulatory Visit: Payer: Self-pay

## 2021-07-23 VITALS — BP 132/64 | HR 71 | Ht 67.0 in | Wt 226.0 lb

## 2021-07-23 DIAGNOSIS — E669 Obesity, unspecified: Secondary | ICD-10-CM

## 2021-07-23 DIAGNOSIS — Z136 Encounter for screening for cardiovascular disorders: Secondary | ICD-10-CM | POA: Diagnosis not present

## 2021-07-23 DIAGNOSIS — E782 Mixed hyperlipidemia: Secondary | ICD-10-CM

## 2021-07-23 DIAGNOSIS — I10 Essential (primary) hypertension: Secondary | ICD-10-CM

## 2021-07-23 DIAGNOSIS — N529 Male erectile dysfunction, unspecified: Secondary | ICD-10-CM

## 2021-07-23 LAB — LIPID PANEL
Chol/HDL Ratio: 3.5 ratio (ref 0.0–5.0)
Cholesterol, Total: 154 mg/dL (ref 100–199)
HDL: 44 mg/dL (ref >39)
LDL Chol Calc (NIH): 94 mg/dL (ref 0–99)
Triglycerides: 86 mg/dL (ref 0–149)
VLDL Cholesterol Cal: 16 mg/dL (ref 5–40)

## 2021-07-23 LAB — HEPATIC FUNCTION PANEL
ALT: 22 IU/L (ref 0–44)
AST: 23 IU/L (ref 0–40)
Albumin: 4.4 g/dL (ref 3.7–4.7)
Alkaline Phosphatase: 93 IU/L (ref 44–121)
Bilirubin Total: 0.2 mg/dL (ref 0.0–1.2)
Bilirubin, Direct: 0.1 mg/dL (ref 0.00–0.40)
Total Protein: 7.3 g/dL (ref 6.0–8.5)

## 2021-07-23 NOTE — Patient Instructions (Signed)
Medication Instructions:   Your physician recommends that you continue on your current medications as directed. Please refer to the Current Medication list given to you today.  *If you need a refill on your cardiac medications before your next appointment, please call your pharmacy*   Lab Work:  TODAY!!!!!  LFT/LIPID  If you have labs (blood work) drawn today and your tests are completely normal, you will receive your results only by: [facility] (if you have MyChart) OR A paper copy in the mail If you have any lab test that is abnormal or we need to change your treatment, we will call you to review the results.   Testing/Procedures:  None ordered.    Follow-Up: At [facility], you and your health needs are our priority.  As part of our continuing mission to provide you with exceptional heart care, we have created designated Provider Care Teams.  These Care Teams include your primary Cardiologist (physician) and Advanced Practice Providers (APPs -  Physician Assistants and Nurse Practitioners) who all work together to provide you with the care you need, when you need it.  We recommend signing up for the patient portal called "MyChart".  Sign up information is provided on this After Visit Summary.  MyChart is used to connect with patients for Virtual Visits (Telemedicine).  Patients are able to view lab/test results, encounter notes, upcoming appointments, etc.  Non-urgent messages can be sent to your provider as well.   To learn more about what you can do with MyChart, go to [email or web address].    Your next appointment:   1 year(s)  The format for your next appointment:   In Person  Provider:   Larae Grooms, MD     Other Instructions  Your physician wants you to follow-up in: 1 year with Dr. Irish Lack.  You will receive a reminder letter in the mail two months in advance. If you don't receive a letter, please call our office to schedule the follow-up  appointment.  Low-Sodium Eating Plan Sodium, which is an element that makes up salt, helps you maintain a healthy balance of fluids in your body. Too much sodium can increase your blood pressure and cause fluid and waste to be held in your body. Your health care provider or dietitian may recommend following this plan if you have high blood pressure (hypertension), kidney disease, liver disease, or heart failure. Eating less sodium can help lower your blood pressure, reduce swelling, and protect your heart, liver, and kidneys. What are tips for following this plan? Reading food labels The Nutrition Facts label lists the amount of sodium in one serving of the food. If you eat more than one serving, you must multiply the listed amount of sodium by the number of servings. Choose foods with less than 140 mg of sodium per serving. Avoid foods with 300 mg of sodium or more per serving. Shopping  Look for lower-sodium products, often labeled as "low-sodium" or "no salt added." Always check the sodium content, even if foods are labeled as "unsalted" or "no salt added." Buy fresh foods. Avoid canned foods and pre-made or frozen meals. Avoid canned, cured, or processed meats. Buy breads that have less than 80 mg of sodium per slice. Cooking  Eat more home-cooked food and less restaurant, buffet, and fast food. Avoid adding salt when cooking. Use salt-free seasonings or herbs instead of table salt or sea salt. Check with your health care provider or pharmacist before using salt substitutes. Cook with plant-based oils, such  as canola, sunflower, or olive oil. Meal planning When eating at a restaurant, ask that your food be prepared with less salt or no salt, if possible. Avoid dishes labeled as brined, pickled, cured, smoked, or made with soy sauce, miso, or teriyaki sauce. Avoid foods that contain MSG (monosodium glutamate). MSG is sometimes added to [place] food, bouillon, and some canned foods. Make  meals that can be grilled, baked, poached, roasted, or steamed. These are generally made with less sodium. General information Most people on this plan should limit their sodium intake to 1,500-2,000 mg (milligrams) of sodium each day. What foods should I eat? Fruits Fresh, frozen, or canned fruit. Fruit juice. Vegetables Fresh or frozen vegetables. "No salt added" canned vegetables. "No salt added" tomato sauce and paste. Low-sodium or reduced-sodium tomato and vegetable juice. Grains Low-sodium cereals, including oats, puffed wheat and rice, and shredded wheat. Low-sodium crackers. Unsalted rice. Unsalted pasta. Low-sodium bread. Whole-grain breads and whole-grain pasta. Meats and other proteins Fresh or frozen (no salt added) meat, poultry, seafood, and fish. Low-sodium canned tuna and salmon. Unsalted nuts. Dried peas, beans, and lentils without added salt. Unsalted canned beans. Eggs. Unsalted nut butters. Dairy Milk. Soy milk. Cheese that is naturally low in sodium, such as ricotta cheese, fresh mozzarella, or Swiss cheese. Low-sodium or reduced-sodium cheese. Cream cheese. Yogurt. Seasonings and condiments Fresh and dried herbs and spices. Salt-free seasonings. Low-sodium mustard and ketchup. Sodium-free salad dressing. Sodium-free light mayonnaise. Fresh or refrigerated horseradish. Lemon juice. Vinegar. Other foods Homemade, reduced-sodium, or low-sodium soups. Unsalted popcorn and pretzels. Low-salt or salt-free chips. The items listed above may not be a complete list of foods and beverages you can eat. Contact a dietitian for more information. What foods should I avoid? Vegetables Sauerkraut, pickled vegetables, and relishes. Olives. [place] fries. Onion rings. Regular canned vegetables (not low-sodium or reduced-sodium). Regular canned tomato sauce and paste (not low-sodium or reduced-sodium). Regular tomato and vegetable juice (not low-sodium or reduced-sodium). Frozen vegetables in  sauces. Grains Instant hot cereals. Bread stuffing, pancake, and biscuit mixes. Croutons. Seasoned rice or pasta mixes. Noodle soup cups. Boxed or frozen macaroni and cheese. Regular salted crackers. Self-rising flour. Meats and other proteins Meat or fish that is salted, canned, smoked, spiced, or pickled. Precooked or cured meat, such as sausages or meat loaves. Berniece Salines. Ham. Pepperoni. Hot dogs. Corned beef. Chipped beef. Salt pork. Jerky. Pickled herring. Anchovies and sardines. Regular canned tuna. Salted nuts. Dairy Processed cheese and cheese spreads. Hard cheeses. Cheese curds. Blue cheese. Feta cheese. String cheese. Regular cottage cheese. Buttermilk. Canned milk. Fats and oils Salted butter. Regular margarine. Ghee. Bacon fat. Seasonings and condiments Onion salt, garlic salt, seasoned salt, table salt, and sea salt. Canned and packaged gravies. Worcestershire sauce. Tartar sauce. Barbecue sauce. Teriyaki sauce. Soy sauce, including reduced-sodium. Steak sauce. Fish sauce. Oyster sauce. Cocktail sauce. Horseradish that you find on the shelf. Regular ketchup and mustard. Meat flavorings and tenderizers. Bouillon cubes. Hot sauce. Pre-made or packaged marinades. Pre-made or packaged taco seasonings. Relishes. Regular salad dressings. Salsa. Other foods Salted popcorn and pretzels. Corn chips and puffs. Potato and tortilla chips. Canned or dried soups. Pizza. Frozen entrees and pot pies. The items listed above may not be a complete list of foods and beverages you should avoid. Contact a dietitian for more information. Summary Eating less sodium can help lower your blood pressure, reduce swelling, and protect your heart, liver, and kidneys. Most people on this plan should limit their sodium intake to 1,500-2,000 mg (  milligrams) of sodium each day. Canned, boxed, and frozen foods are high in sodium. Restaurant foods, fast foods, and pizza are also very high in sodium. You also get sodium by  adding salt to food. Try to cook at home, eat more fresh fruits and vegetables, and eat less fast food and canned, processed, or prepared foods. This information is not intended to replace advice given to you by your health care provider. Make sure you discuss any questions you have with your health care provider. Document Revised: 07/16/2019 Document Reviewed: 05/12/2019 Elsevier Patient Education  2022 [place] Eating Plan DASH stands for Dietary Approaches to Stop Hypertension. The DASH eating plan is a healthy eating plan that has been shown to: Reduce high blood pressure (hypertension). Reduce your risk for type 2 diabetes, heart disease, and stroke. Help with weight loss. What are tips for following this plan? Reading food labels Check food labels for the amount of salt (sodium) per serving. Choose foods with less than 5 percent of the Daily Value of sodium. Generally, foods with less than 300 milligrams (mg) of sodium per serving fit into this eating plan. To find whole grains, look for the word "whole" as the first word in the ingredient list. Shopping Buy products labeled as "low-sodium" or "no salt added." Buy fresh foods. Avoid canned foods and pre-made or frozen meals. Cooking Avoid adding salt when cooking. Use salt-free seasonings or herbs instead of table salt or sea salt. Check with your health care provider or pharmacist before using salt substitutes. Do not fry foods. Cook foods using healthy methods such as baking, boiling, grilling, roasting, and broiling instead. Cook with heart-healthy oils, such as olive, canola, avocado, soybean, or sunflower oil. Meal planning  Eat a balanced diet that includes: 4 or more servings of fruits and 4 or more servings of vegetables each day. Try to fill one-half of your plate with fruits and vegetables. 6-8 servings of whole grains each day. Less than 6 oz (170 g) of lean meat, poultry, or fish each day. A 3-oz (85-g)  serving of meat is about the same size as a deck of cards. One egg equals 1 oz (28 g). 2-3 servings of low-fat dairy each day. One serving is 1 cup (237 mL). 1 serving of nuts, seeds, or beans 5 times each week. 2-3 servings of heart-healthy fats. Healthy fats called omega-3 fatty acids are found in foods such as walnuts, flaxseeds, fortified milks, and eggs. These fats are also found in cold-water fish, such as sardines, salmon, and mackerel. Limit how much you eat of: Canned or prepackaged foods. Food that is high in trans fat, such as some fried foods. Food that is high in saturated fat, such as fatty meat. Desserts and other sweets, sugary drinks, and other foods with added sugar. Full-fat dairy products. Do not salt foods before eating. Do not eat more than 4 egg yolks a week. Try to eat at least 2 vegetarian meals a week. Eat more home-cooked food and less restaurant, buffet, and fast food. Lifestyle When eating at a restaurant, ask that your food be prepared with less salt or no salt, if possible. If you drink alcohol: Limit how much you use to: 0-1 drink a day for women who are not pregnant. 0-2 drinks a day for men. Be aware of how much alcohol is in your drink. In the [place] one drink equals one 12 oz bottle of beer (355 mL), one 5 oz glass of wine (  148 mL), or one 1 oz glass of hard liquor (44 mL). General information Avoid eating more than 2,300 mg of salt a day. If you have hypertension, you may need to reduce your sodium intake to 1,500 mg a day. Work with your health care provider to maintain a healthy body weight or to lose weight. Ask what an ideal weight is for you. Get at least 30 minutes of exercise that causes your heart to beat faster (aerobic exercise) most days of the week. Activities may include walking, swimming, or biking. Work with your health care provider or dietitian to adjust your eating plan to your individual calorie needs. What foods should I  eat? Fruits All fresh, dried, or frozen fruit. Canned fruit in natural juice (without added sugar). Vegetables Fresh or frozen vegetables (raw, steamed, roasted, or grilled). Low-sodium or reduced-sodium tomato and vegetable juice. Low-sodium or reduced-sodium tomato sauce and tomato paste. Low-sodium or reduced-sodium canned vegetables. Grains Whole-grain or whole-wheat bread. Whole-grain or whole-wheat pasta. Brown rice. [place]. Bulgur. Whole-grain and low-sodium cereals. Pita bread. Low-fat, low-sodium crackers. Whole-wheat flour tortillas. Meats and other proteins Skinless chicken or [place]. Ground chicken or [place]. Pork with fat trimmed off. Fish and seafood. Egg whites. Dried beans, peas, or lentils. Unsalted nuts, nut butters, and seeds. Unsalted canned beans. Lean cuts of beef with fat trimmed off. Low-sodium, lean precooked or cured meat, such as sausages or meat loaves. Dairy Low-fat (1%) or fat-free (skim) milk. Reduced-fat, low-fat, or fat-free cheeses. Nonfat, low-sodium ricotta or cottage cheese. Low-fat or nonfat yogurt. Low-fat, low-sodium cheese. Fats and oils Soft margarine without trans fats. Vegetable oil. Reduced-fat, low-fat, or light mayonnaise and salad dressings (reduced-sodium). Canola, safflower, olive, avocado, soybean, and sunflower oils. Avocado. Seasonings and condiments Herbs. Spices. Seasoning mixes without salt. Other foods Unsalted popcorn and pretzels. Fat-free sweets. The items listed above may not be a complete list of foods and beverages you can eat. Contact a dietitian for more information. What foods should I avoid? Fruits Canned fruit in a light or heavy syrup. Fried fruit. Fruit in cream or butter sauce. Vegetables Creamed or fried vegetables. Vegetables in a cheese sauce. Regular canned vegetables (not low-sodium or reduced-sodium). Regular canned tomato sauce and paste (not low-sodium or reduced-sodium). Regular tomato and vegetable juice  (not low-sodium or reduced-sodium). Angie Fava. Olives. Grains Baked goods made with fat, such as croissants, muffins, or some breads. Dry pasta or rice meal packs. Meats and other proteins Fatty cuts of meat. Ribs. Fried meat. Berniece Salines. Bologna, salami, and other precooked or cured meats, such as sausages or meat loaves. Fat from the back of a pig (fatback). Bratwurst. Salted nuts and seeds. Canned beans with added salt. Canned or smoked fish. Whole eggs or egg yolks. Chicken or [place] with skin. Dairy Whole or 2% milk, cream, and half-and-half. Whole or full-fat cream cheese. Whole-fat or sweetened yogurt. Full-fat cheese. Nondairy creamers. Whipped toppings. Processed cheese and cheese spreads. Fats and oils Butter. Stick margarine. Lard. Shortening. Ghee. Bacon fat. Tropical oils, such as coconut, palm kernel, or palm oil. Seasonings and condiments Onion salt, garlic salt, seasoned salt, table salt, and sea salt. Worcestershire sauce. Tartar sauce. Barbecue sauce. Teriyaki sauce. Soy sauce, including reduced-sodium. Steak sauce. Canned and packaged gravies. Fish sauce. Oyster sauce. Cocktail sauce. Store-bought horseradish. Ketchup. Mustard. Meat flavorings and tenderizers. Bouillon cubes. Hot sauces. Pre-made or packaged marinades. Pre-made or packaged taco seasonings. Relishes. Regular salad dressings. Other foods Salted popcorn and pretzels. The items listed above may not be  a complete list of foods and beverages you should avoid. Contact a dietitian for more information. Where to find more information National Heart, Lung, and Blood Institute: https://wilson-eaton.com/ American Heart Association: www.heart.org Academy of Nutrition and Dietetics: www.eatright.[place]: www.kidney.org Summary The DASH eating plan is a healthy eating plan that has been shown to reduce high blood pressure (hypertension). It may also reduce your risk for type 2 diabetes, heart disease, and  stroke. When on the DASH eating plan, aim to eat more fresh fruits and vegetables, whole grains, lean proteins, low-fat dairy, and heart-healthy fats. With the DASH eating plan, you should limit salt (sodium) intake to 2,300 mg a day. If you have hypertension, you may need to reduce your sodium intake to 1,500 mg a day. Work with your health care provider or dietitian to adjust your eating plan to your individual calorie needs. This information is not intended to replace advice given to you by your health care provider. Make sure you discuss any questions you have with your health care provider. Document Revised: 05/14/2019 Document Reviewed: 05/14/2019 Elsevier Patient Education  2022 [employer].

## 2021-08-17 ENCOUNTER — Telehealth: Payer: Self-pay

## 2021-08-17 ENCOUNTER — Encounter: Payer: Self-pay | Admitting: Gastroenterology

## 2021-08-17 NOTE — Telephone Encounter (Signed)
Pt scheduled to see Dr. Tarri Glenn for + Cologuard. Review of records indicate pt can be scheduled as a direct colon WITHOUT need for an OV. Scheduling advised to cancel OV and to schedule direct colon.  Schedulers attempted to cancel OV and schedule direct, however, pt indicates he is on a blood thinner. Review of pt medications and cardiology records DOES NOT indicate pt is on blood thinner. Therefore, called pt to inquire further about the name of his blood thinner and who prescribes this blood thinner. If pt is in fact on blood thinner, OV will be required and clearance will need to be obtained. However, if not, direct colon can be scheduled. LVM requesting returned call.

## 2021-08-17 NOTE — Telephone Encounter (Signed)
Pt returned call. Asked pt to obtain his Rx bottles so that we can compare to [facility]. MAR is up to date and does not reflect any anticoags. Pt can be scheduled for direct colon. Schedulers made aware.

## 2021-09-04 ENCOUNTER — Other Ambulatory Visit: Payer: Self-pay

## 2021-09-04 ENCOUNTER — Ambulatory Visit (AMBULATORY_SURGERY_CENTER): Payer: Medicare Other

## 2021-09-04 VITALS — Ht 67.0 in | Wt 229.0 lb

## 2021-09-04 DIAGNOSIS — Z1211 Encounter for screening for malignant neoplasm of colon: Secondary | ICD-10-CM

## 2021-09-04 MED ORDER — PEG 3350-KCL-NA BICARB-NACL 420 G PO SOLR: 4000.0000 mL | Freq: Once | ORAL | 0 refills | Status: AC

## 2021-09-04 NOTE — Progress Notes (Signed)
No allergies to soy or egg Pt is not on blood thinners or diet pills Denies issues with sedation/intubation Denies atrial flutter/fib Denies constipation   Emmi instructions given to pt  Pt is aware of Covid safety and care partner requirements.  

## 2021-09-07 ENCOUNTER — Ambulatory Visit: Payer: Medicare Other | Admitting: Gastroenterology

## 2021-09-12 ENCOUNTER — Other Ambulatory Visit: Payer: Self-pay | Admitting: Interventional Cardiology

## 2021-09-18 ENCOUNTER — Ambulatory Visit (AMBULATORY_SURGERY_CENTER): Payer: Medicare Other | Admitting: Gastroenterology

## 2021-09-18 ENCOUNTER — Encounter: Payer: Self-pay | Admitting: Gastroenterology

## 2021-09-18 ENCOUNTER — Other Ambulatory Visit: Payer: Self-pay

## 2021-09-18 VITALS — BP 115/76 | HR 60 | Temp 98.6°F | Resp 15 | Ht 67.0 in | Wt 229.0 lb

## 2021-09-18 DIAGNOSIS — R195 Other fecal abnormalities: Secondary | ICD-10-CM

## 2021-09-18 DIAGNOSIS — D128 Benign neoplasm of rectum: Secondary | ICD-10-CM | POA: Diagnosis not present

## 2021-09-18 DIAGNOSIS — D122 Benign neoplasm of ascending colon: Secondary | ICD-10-CM | POA: Diagnosis not present

## 2021-09-18 DIAGNOSIS — D123 Benign neoplasm of transverse colon: Secondary | ICD-10-CM

## 2021-09-18 DIAGNOSIS — D120 Benign neoplasm of cecum: Secondary | ICD-10-CM | POA: Diagnosis not present

## 2021-09-18 DIAGNOSIS — Z1211 Encounter for screening for malignant neoplasm of colon: Secondary | ICD-10-CM | POA: Diagnosis not present

## 2021-09-18 HISTORY — PX: COLONOSCOPY: SHX174

## 2021-09-18 MED ORDER — SODIUM CHLORIDE 0.9 % IV SOLN: 500.0000 mL | Freq: Once | INTRAVENOUS | Status: DC

## 2021-09-18 NOTE — Progress Notes (Signed)
? ?Referring Provider: Roselee Nova, MD ?Primary Care Physician:  Roselee Nova, MD ? ?Reason for Procedure:  + Cologuard ? ? ?IMPRESSION:  ?+ Cologuard ?Appropriate candidate for monitored anesthesia care ? ?PLAN: ?Colonoscopy in the [place] today ? ? ?HPI: Benjamin Vega is a 71 y.o. male presents for colonoscopy to evaluate + Cologuard. ? ?Colonoscopy with Dr. Marlin Canary 07/2003: Normal ? ?Colonoscopy with Dr. Deatra Ina 07/14/2014: Left-sided diverticulosis, no polyps.  Surveillance colonoscopy recommended in 10 years. ? ?No baseline GI symptoms.  ? ?No known family history of colon cancer or polyps. No family history of uterine/endometrial cancer, pancreatic cancer or gastric/stomach cancer. ? ? ?Past Medical History:  ?Diagnosis Date  ? Edema   ? Erectile dysfunction   ? HTN (hypertension)   ? Hypercholesteremia   ? Hyperlipemia   ? ? ?Past Surgical History:  ?Procedure Laterality Date  ? COLONOSCOPY    ? COLONOSCOPY  09/18/2021  ? FOOT SURGERY    ? ? ?Current Outpatient Medications  ?Medication Sig Dispense Refill  ? amLODipine (NORVASC) 10 MG tablet TAKE 1 TABLET DAILY 90 tablet 3  ? aspirin EC 81 MG tablet Take 1 tablet (81 mg total) by mouth daily. 90 tablet 3  ? atorvastatin (LIPITOR) 10 MG tablet TAKE 1 TABLET DAILY 90 tablet 3  ? carvedilol (COREG) 12.5 MG tablet TAKE 1 TABLET TWICE A DAY 180 tablet 3  ? COMBIGAN 0.2-0.5 % ophthalmic solution Place 1 drop into both eyes every 12 (twelve) hours.     ? olmesartan (BENICAR) 40 MG tablet Take 1 tablet by mouth daily Please keep 07/23/21 appt with Richardson Dopp, PA for additional refills 90 tablet 0  ? spironolactone (ALDACTONE) 25 MG tablet TAKE 1 TABLET DAILY 90 tablet 3  ? timolol (TIMOPTIC) 0.5 % ophthalmic solution 1 drop 2 (two) times daily.    ? sildenafil (REVATIO) 20 MG tablet Take 3-5 tablets by mouth AS NEEDED 1 hour prior to sexual activity (Patient not taking: Reported on 09/04/2021) 30 tablet 3  ? ?Current Facility-Administered Medications   ?Medication Dose Route Frequency Provider Last Rate Last Admin  ? 0.9 %  sodium chloride infusion  500 mL Intravenous Once [place], MD      ? ? ?Allergies as of 09/18/2021  ? (No Known Allergies)  ? ? ?Family History  ?Problem Relation Age of Onset  ? Diabetes Mother   ? Hypertension Father   ? Heart disease Father   ? Esophageal cancer Sister   ? Colon cancer Neg Hx   ? Colon polyps Neg Hx   ? Stomach cancer Neg Hx   ? Rectal cancer Neg Hx   ? ? ? ?Physical Exam: ?General:   Alert,  well-nourished, pleasant and cooperative in NAD ?Head:  Normocephalic and atraumatic. ?Eyes:  Sclera clear, no icterus.   Conjunctiva pink. ?Mouth:  No deformity or lesions.   ?Neck:  Supple; no masses or thyromegaly. ?Lungs:  Clear throughout to auscultation.   No wheezes. ?Heart:  Regular rate and rhythm; no murmurs. ?Abdomen:  Soft, non-tender, nondistended, normal bowel sounds, no rebound or guarding.  ?Msk:  Symmetrical. No boney deformities ?LAD: No inguinal or umbilical LAD ?Extremities:  No clubbing or edema. ?Neurologic:  Alert and  oriented x4;  grossly nonfocal ?Skin:  No obvious rash or bruise. ?Psych:  Alert and cooperative. Normal mood and affect. ? ? ? ? ?Studies/Results: ?No results found. ? ? ? ?Sanjuana Mruk L. Tarri Glenn, MD, MPH ?09/18/2021, 3:24 PM ? ? ? ?  ?

## 2021-09-18 NOTE — Patient Instructions (Signed)
Please read handouts provided. ?Continue present medications. ?High Fiber Diet. ?Await pathology results. ? ? ?YOU HAD AN ENDOSCOPIC PROCEDURE TODAY AT [facility]:   Refer to the procedure report that was given to you for any specific questions about what was found during the examination.  If the procedure report does not answer your questions, please call your gastroenterologist to clarify.  If you requested that your care partner not be given the details of your procedure findings, then the procedure report has been included in a sealed envelope for you to review at your convenience later. ? ?YOU SHOULD EXPECT: Some feelings of bloating in the abdomen. Passage of more gas than usual.  Walking can help get rid of the air that was put into your GI tract during the procedure and reduce the bloating. If you had a lower endoscopy (such as a colonoscopy or flexible sigmoidoscopy) you may notice spotting of blood in your stool or on the toilet paper. If you underwent a bowel prep for your procedure, you may not have a normal bowel movement for a few days. ? ?Please Note:  You might notice some irritation and congestion in your nose or some drainage.  This is from the oxygen used during your procedure.  There is no need for concern and it should clear up in a day or so. ? ?SYMPTOMS TO REPORT IMMEDIATELY: ? ?Following lower endoscopy (colonoscopy or flexible sigmoidoscopy): ? Excessive amounts of blood in the stool ? Significant tenderness or worsening of abdominal pains ? Swelling of the abdomen that is new, acute ? Fever of 100?F or higher ? ? ?For urgent or emergent issues, a gastroenterologist can be reached at any hour by calling [phone]. ?Do not use MyChart messaging for urgent concerns.  ? ? ?DIET:  We do recommend a small meal at first, but then you may proceed to your regular diet.  Drink plenty of fluids but you should avoid alcoholic beverages for 24 hours. ? ?ACTIVITY:  You should plan  to take it easy for the rest of today and you should NOT DRIVE or use heavy machinery until tomorrow (because of the sedation medicines used during the test).   ? ?FOLLOW UP: ?Our staff will call the number listed on your records 48-72 hours following your procedure to check on you and address any questions or concerns that you may have regarding the information given to you following your procedure. If we do not reach you, we will leave a message.  We will attempt to reach you two times.  During this call, we will ask if you have developed any symptoms of COVID 19. If you develop any symptoms (ie: fever, flu-like symptoms, shortness of breath, cough etc.) before then, please call [phone].  If you test positive for Covid 19 in the 2 weeks post procedure, please call and report this information to [place].   ? ?If any biopsies were taken you will be contacted by phone or by letter within the next 1-3 weeks.  Please call us at [phone] if you have not heard about the biopsies in 3 weeks.  ? ? ?SIGNATURES/CONFIDENTIALITY: ?You and/or your care partner have signed paperwork which will be entered into your electronic medical record.  These signatures attest to the fact that that the information above on your After Visit Summary has been reviewed and is understood.  Full responsibility of the confidentiality of this discharge information lies with you and/or your care-partner.  ?

## 2021-09-18 NOTE — Progress Notes (Signed)
Pt's states no medical or surgical changes since previsit or office visit.  VS CW  

## 2021-09-18 NOTE — Progress Notes (Signed)
Report to PACU, RN, vss, BBS= Clear.  

## 2021-09-18 NOTE — Op Note (Signed)
[facility] ?Patient Name: Benjamin Vega ?Procedure Date: 09/18/2021 3:21 PM ?MRN: [id number] ?Endoscopist: Thornton Park MD, MD ?Age: 71 ?Referring MD:  ?Date of Birth: [birth year] ?Gender: Male ?Account #: [id number] ?Procedure:                Colonoscopy ?Indications:              Positive Cologuard test ?                          Normal colonoscopies with Dr. Deatra Ina in 2006 and  ?                          2016 ?                          No known family history of colon cancer or polyps ?Medicines:                Monitored Anesthesia Care ?Procedure:                Pre-Anesthesia Assessment: ?                          - Prior to the procedure, a History and Physical  ?                          was performed, and patient medications and  ?                          allergies were reviewed. The patient's tolerance of  ?                          previous anesthesia was also reviewed. The risks  ?                          and benefits of the procedure and the sedation  ?                          options and risks were discussed with the patient.  ?                          All questions were answered, and informed consent  ?                          was obtained. Prior Anticoagulants: The patient has  ?                          taken no previous anticoagulant or antiplatelet  ?                          agents. ASA Grade Assessment: II - A patient with  ?                          mild systemic disease. After reviewing the risks  ?  and benefits, the patient was deemed in  ?                          satisfactory condition to undergo the procedure. ?                          After obtaining informed consent, the colonoscope  ?                          was passed under direct vision. Throughout the  ?                          procedure, the patient's blood pressure, pulse, and  ?                          oxygen saturations were monitored continuously. The  ?                          CF  [id number] #[id number] was introduced through the anus  ?                          and advanced to the 3 cm into the ileum. A second  ?                          forward view of the right colon was performed. The  ?                          colonoscopy was performed without difficulty. The  ?                          patient tolerated the procedure well. The quality  ?                          of the bowel preparation was good. The terminal  ?                          ileum, ileocecal valve, appendiceal orifice, and  ?                          rectum were photographed. ?Scope In: 3:34:58 PM ?Scope Out: 3:53:08 PM ?Scope Withdrawal Time: 0 hours 14 minutes 32 seconds  ?Total Procedure Duration: 0 hours 18 minutes 10 seconds  ?Findings:                 The perianal and digital rectal examinations were  ?                          normal. ?                          Non-bleeding internal hemorrhoids were found. ?                          Multiple small and large-mouthed diverticula were  ?  found in the sigmoid colon and descending colon. ?                          A 3 mm polyp was found in the rectum. The polyp was  ?                          flat. The polyp was removed with a cold snare.  ?                          Resection and retrieval were complete. Estimated  ?                          blood loss was minimal. ?                          A 3 mm polyp was found in the transverse colon. The  ?                          polyp was sessile. The polyp was removed with a  ?                          cold snare. Resection and retrieval were complete.  ?                          Estimated blood loss was minimal. ?                          Two small 2 mm polyps were found in the ascending  ?                          colon. The polyps were sessile. The polyps were  ?                          removed with a cold snare. Resection and retrieval  ?                          were complete. Estimated blood loss was  minimal. ?                          A 2 mm polyp was found in the cecum. The polyp was  ?                          sessile. The polyp was removed with a cold snare.  ?                          Resection and retrieval were complete. Estimated  ?                          blood loss was minimal. ?                          The exam was otherwise without abnormality on  ?  direct and retroflexion views. ?Complications:            No immediate complications. ?Estimated Blood Loss:     Estimated blood loss was minimal. ?Impression:               - Non-bleeding internal hemorrhoids. ?                          - Diverticulosis in the sigmoid colon and in the  ?                          descending colon. ?                          - One 3 mm polyp in the rectum, removed with a cold  ?                          snare. Resected and retrieved. ?                          - One 3 mm polyp in the transverse colon, removed  ?                          with a cold snare. Resected and retrieved. ?                          - Two 2 mm polyp in the ascending colon, removed  ?                          with a cold snare. Resected and retrieved. ?                          - One 2 mm polyp in the cecum, removed with a cold  ?                          snare. Resected and retrieved. ?                          - The examination was otherwise normal on direct  ?                          and retroflexion views. ?Recommendation:           - Patient has a contact number available for  ?                          emergencies. The signs and symptoms of potential  ?                          delayed complications were discussed with the  ?                          patient. Return to normal activities tomorrow.  ?  Written discharge instructions were provided to the  ?                          patient. ?                          - Follow a high fiber diet. Drink at least 64  ?                          ounces  of water daily. Add a daily stool bulking  ?                          agent such as psyllium (an exampled would be  ?                          Metamucil). ?                          - Continue present medications. ?                          - Await pathology results. ?                          - Repeat colonoscopy in 3 years for surveillance if  ?                          at least 3 polyps are adenomas. ?                          - Emerging evidence supports eating a diet of  ?                          fruits, vegetables, grains, calcium, and yogurt  ?                          while reducing red meat and alcohol may reduce the  ?                          risk of colon cancer. ?                          - Thank you for allowing me to be involved in your  ?                          colon cancer prevention. ?Thornton Park MD, MD ?09/18/2021 3:59:53 PM ?This report has been signed electronically. ?

## 2021-09-18 NOTE — Progress Notes (Signed)
Called to room to assist during endoscopic procedure.  Patient ID and intended procedure confirmed with present staff. Received instructions for my participation in the procedure from the performing physician.  

## 2021-09-20 ENCOUNTER — Telehealth: Payer: Self-pay

## 2021-09-20 NOTE — Telephone Encounter (Signed)
?  Follow up Call- ? ? ?  09/18/2021  ?  2:08 PM  ?Call back number  ?Post procedure Call Back phone  # [phone]  ?Permission to leave phone message Yes  ?  ? ?Patient questions: ? ?Do you have a fever, pain , or abdominal swelling? No. ?Pain Score  0 * ? ?Have you tolerated food without any problems? Yes.   ? ?Have you been able to return to your normal activities? Yes.   ? ?Do you have any questions about your discharge instructions: ?Diet   No. ?Medications  No. ?Follow up visit  No. ? ?Do you have questions or concerns about your Care? No. ? ?Actions: ?* If pain score is 4 or above: ?No action needed, pain <4. ? ? ?

## 2021-09-25 ENCOUNTER — Encounter: Payer: Self-pay | Admitting: Gastroenterology

## 2021-10-19 ENCOUNTER — Other Ambulatory Visit: Payer: Self-pay | Admitting: Physician Assistant

## 2022-01-13 ENCOUNTER — Other Ambulatory Visit: Payer: Self-pay | Admitting: Interventional Cardiology

## 2022-02-11 ENCOUNTER — Telehealth: Payer: Self-pay | Admitting: Interventional Cardiology

## 2022-02-11 NOTE — Telephone Encounter (Signed)
Pt c/o medication issue:  1. Name of Medication: olmesartan (BENICAR) 40 MG tablet  2. How are you currently taking this medication (dosage and times per day)? Take 1 tablet by mouth daily  3. Are you having a reaction (difficulty breathing--STAT)?   4. What is your medication issue? Patient states [facility] told him that Dr. Irish Lack stop this medication. He states he never got that message from [place].

## 2022-02-11 NOTE — Telephone Encounter (Signed)
OK to stop olmesartan and start losartan 100 mg daily.

## 2022-02-11 NOTE — Telephone Encounter (Signed)
Pt is calling as instructed by [facility] pharmacy, to ask Dr. Irish Lack if he would consider switching the pts olmesartan regimen to losartan.   Pt states pharmacy endorsed to him that Olmesartan will cost him $15 a month, and losartan will be covered in full cost, costing him nothing monthly.  Pt states pharmacy did not give him the equivalent dose of what losartan Dr. Irish Lack should advise on.   Pt is asking if Dr. Irish Lack would consider switching him from olmesartan to losartan, for cost effective reasons.   Pt aware that I will route this message to Dr. Irish Lack to further advise on this request, and if ok to switch, what dose of losartan would he like to prescribe for the pt.   Pt aware a triage nurse will follow-up with him accordingly thereafter, once advisement is provided.   Pt verbalized understanding and agrees with this plan.

## 2022-02-12 NOTE — Telephone Encounter (Signed)
Left the pt a message to call back to discuss medication recommendations per Dr. Irish Lack.

## 2022-02-19 ENCOUNTER — Other Ambulatory Visit: Payer: Self-pay | Admitting: Family Medicine

## 2022-02-19 ENCOUNTER — Ambulatory Visit
Admission: RE | Admit: 2022-02-19 | Discharge: 2022-02-19 | Disposition: A | Discharge status: Home / Self Care | Payer: Medicare Other | Source: Ambulatory Visit | Attending: Family Medicine | Admitting: Family Medicine

## 2022-02-19 DIAGNOSIS — M5440 Lumbago with sciatica, unspecified side: Secondary | ICD-10-CM

## 2022-02-20 MED ORDER — LOSARTAN POTASSIUM 100 MG PO TABS: 100.0000 mg | ORAL_TABLET | Freq: Every day | ORAL | 3 refills | Status: DC

## 2022-02-20 NOTE — Telephone Encounter (Signed)
Called patient about Dr. Hassell Done recommendations. Patient verbalized understanding. Will send losartan 100 mg to patient's pharmacy and take olmesartan off patient's medication list.

## 2022-07-23 NOTE — Progress Notes (Signed)
Cardiology Office Note   Date:  07/25/2022   ID:  [name], [name] 1951/03/14, MRN [id number]  PCP:  Ellyn Hack, MD    No chief complaint on file.  HTN  Wt Readings from Last 3 Encounters:  07/25/22 218 lb 6.4 oz (99.1 kg)  09/18/21 229 lb (103.9 kg)  09/04/21 229 lb (103.9 kg)       History of Present Illness: Benjamin Vega is a 72 y.o. male   with history of hypertension, life screening done in 2016 &  2017 normal with no AAA normal ABIs and normal carotids.  Also has hyperlipidemia, erectile dysfunction.   He has had chronic hip pain which limited walking, in the past. THis is due to arthritis.  He had DOE when he took steroids for the arthritis.   BP has typically higher in the MDs office.   Felt some tachycardia in the past which he thinks this was due to steroids.  Improved with carvedilol.     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  He had a normal stress test in August 2019.  He had normal ejection fraction in August 2019 by echocardiogram as well.     Had foot surgery in 2021 to remove a bunion.  THis went well.  Limited his walking for a short time.    2021 screening u/s showed aortic size of 2.9 cm.    Denies : Chest pain. Dizziness. Leg edema. Nitroglycerin use. Orthopnea. Palpitations. Paroxysmal nocturnal dyspnea. Shortness of breath. Syncope.    Walks 3 miles in 40 minutes, 3x/week.  No cardiac sx.  Home blood pressure readings in the 110s to 140 range, with most being in the 110's and 120s systolic.    Past Medical History:  Diagnosis Date   Edema    Erectile dysfunction    HTN (hypertension)    Hypercholesteremia    Hyperlipemia     Past Surgical History:  Procedure Laterality Date   COLONOSCOPY     COLONOSCOPY  09/18/2021   FOOT SURGERY       Current Outpatient Medications  Medication Sig Dispense Refill   amLODipine (NORVASC) 10 MG tablet TAKE 1 TABLET DAILY 90 tablet 3    aspirin EC 81 MG tablet Take 1 tablet (81 mg total) by mouth daily. 90 tablet 3   atorvastatin (LIPITOR) 10 MG tablet TAKE 1 TABLET DAILY 90 tablet 3   carvedilol (COREG) 12.5 MG tablet TAKE 1 TABLET TWICE A DAY 180 tablet 3   COMBIGAN 0.2-0.5 % ophthalmic solution Place 1 drop into both eyes every 12 (twelve) hours.      losartan (COZAAR) 100 MG tablet Take 1 tablet (100 mg total) by mouth daily. 90 tablet 3   spironolactone (ALDACTONE) 25 MG tablet TAKE 1 TABLET DAILY 90 tablet 3   timolol (TIMOPTIC) 0.5 % ophthalmic solution 1 drop 2 (two) times daily.     brimonidine (ALPHAGAN) 0.2 % ophthalmic solution INSTILL 1 DROP INTO AFFECTED EYE(S) BY OPHTHALMIC ROUTE EVERY 8 HOURS     fluticasone (FLONASE) 50 MCG/ACT nasal spray      No current facility-administered medications for this visit.    Allergies:   Patient has no known allergies.    Social History:  The patient  reports that he has quit smoking. He has quit using smokeless tobacco.  His smokeless tobacco use included chew. He reports current alcohol use of about 4.0 standard drinks  of alcohol per week. He reports that he does not use drugs.   Family History:  The patient's family history includes Diabetes in his mother; Esophageal cancer in his sister; Heart disease in his father; Hypertension in his father.    ROS:  Please see the history of present illness.   Otherwise, review of systems are positive for intentional weight loss.   All other systems are reviewed and negative.    PHYSICAL EXAM: VS:  BP 106/64   Pulse 75   Ht 5\' 7"  (1.702 m)   Wt 218 lb 6.4 oz (99.1 kg)   SpO2 94%   BMI 34.21 kg/m  , BMI Body mass index is 34.21 kg/m. GEN: Well nourished, well developed, in no acute distress HEENT: normal Neck: no JVD, carotid bruits, or masses Cardiac: RRR; no murmurs, rubs, or gallops,no edema  Respiratory:  clear to auscultation bilaterally, normal work of breathing GI: soft, nontender, nondistended, + BS MS: no  deformity or atrophy Skin: warm and dry, no rash Neuro:  Strength and sensation are intact Psych: euthymic mood, full affect   EKG:   The ekg ordered today demonstrates NSR, nonspecific ST changes   Recent Labs: No results found for requested labs within last 365 days.   Lipid Panel    Component Value Date/Time   CHOL 154 07/23/2021 0918   TRIG 86 07/23/2021 0918   HDL 44 07/23/2021 0918   CHOLHDL 3.5 07/23/2021 0918   CHOLHDL 2.9 05/10/2016 0845   VLDL 18 05/10/2016 0845   LDLCALC 94 07/23/2021 0918     Other studies Reviewed: Additional studies/ records that were reviewed today with results demonstrating: labs reviewed.   ASSESSMENT AND PLAN:  Hypertension: well controlled.  Low salt Diet. avoiding processed foods.  Continue regular exercise.  Tolerating medications well.  Did have some CKD stae 3a in January 2022 with creatinine 1.4. Hyperlipidemia: check lipids today.  Has been well-controlled.  LDL 94 in January 2023.  Continue atorvastatin. Obesity: Lost 11 pounds since 2023.  Continue high-fiber, whole food, plant-based diet. Erectile dysfunction: No longer using revatio.    Current medicines are reviewed at length with the patient today.  The patient concerns regarding his medicines were addressed.  The following changes have been made:  No change  Labs/ tests ordered today include:  No orders of the defined types were placed in this encounter.   Recommend 150 minutes/week of aerobic exercise Low fat, low carb, high fiber diet recommended  Disposition:   FU in 1 year   Signed, Lance Muss, MD  07/25/2022 8:25 AM    [facility] [address] [place], [place], [place]  [address] Phone: [phone]; Fax: [phone]

## 2022-07-25 ENCOUNTER — Ambulatory Visit: Payer: Medicare Other | Attending: Interventional Cardiology | Admitting: Interventional Cardiology

## 2022-07-25 ENCOUNTER — Encounter: Payer: Self-pay | Admitting: Interventional Cardiology

## 2022-07-25 VITALS — BP 106/64 | HR 75 | Ht 67.0 in | Wt 218.4 lb

## 2022-07-25 DIAGNOSIS — I10 Essential (primary) hypertension: Secondary | ICD-10-CM | POA: Diagnosis present

## 2022-07-25 DIAGNOSIS — N1831 Chronic kidney disease, stage 3a: Secondary | ICD-10-CM | POA: Insufficient documentation

## 2022-07-25 DIAGNOSIS — E782 Mixed hyperlipidemia: Secondary | ICD-10-CM

## 2022-07-25 DIAGNOSIS — N529 Male erectile dysfunction, unspecified: Secondary | ICD-10-CM | POA: Diagnosis present

## 2022-07-25 LAB — CBC

## 2022-07-25 MED ORDER — SPIRONOLACTONE 25 MG PO TABS: 25.0000 mg | ORAL_TABLET | Freq: Every day | ORAL | 3 refills | Status: DC

## 2022-07-25 MED ORDER — CARVEDILOL 12.5 MG PO TABS: 12.5000 mg | ORAL_TABLET | Freq: Two times a day (BID) | ORAL | 3 refills | Status: DC

## 2022-07-25 MED ORDER — AMLODIPINE BESYLATE 10 MG PO TABS: 10.0000 mg | ORAL_TABLET | Freq: Every day | ORAL | 3 refills | Status: DC

## 2022-07-25 MED ORDER — LOSARTAN POTASSIUM 100 MG PO TABS: 100.0000 mg | ORAL_TABLET | Freq: Every day | ORAL | 3 refills | Status: DC

## 2022-07-25 MED ORDER — ATORVASTATIN CALCIUM 10 MG PO TABS: 10.0000 mg | ORAL_TABLET | Freq: Every day | ORAL | 3 refills | Status: DC

## 2022-07-25 NOTE — Patient Instructions (Signed)
Medication Instructions:  Your physician recommends that you continue on your current medications as directed. Please refer to the Current Medication list given to you today.  *If you need a refill on your cardiac medications before your next appointment, please call your pharmacy*   Lab Work: Lab work to be done today--CMET, CBC, Lipids If you have labs (blood work) drawn today and your tests are completely normal, you will receive your results only by: [place] (if you have MyChart) OR A paper copy in the mail If you have any lab test that is abnormal or we need to change your treatment, we will call you to review the results.   Testing/Procedures: none   Follow-Up: At [facility], you and your health needs are our priority.  As part of our continuing mission to provide you with exceptional heart care, we have created designated Provider Care Teams.  These Care Teams include your primary Cardiologist (physician) and Advanced Practice Providers (APPs -  Physician Assistants and Nurse Practitioners) who all work together to provide you with the care you need, when you need it.  We recommend signing up for the patient portal called "MyChart".  Sign up information is provided on this After Visit Summary.  MyChart is used to connect with patients for Virtual Visits (Telemedicine).  Patients are able to view lab/test results, encounter notes, upcoming appointments, etc.  Non-urgent messages can be sent to your provider as well.   To learn more about what you can do with MyChart, go to [email or web address].    Your next appointment:   12 month(s)  Provider:   Larae Grooms, MD     Other Instructions

## 2022-07-26 LAB — CBC
Hematocrit: 42.2 % (ref 37.5–51.0)
Hemoglobin: 13.9 g/dL (ref 13.0–17.7)
MCH: 30 pg (ref 26.6–33.0)
MCHC: 32.9 g/dL (ref 31.5–35.7)
MCV: 91 fL (ref 79–97)
Platelets: 221 10*3/uL (ref 150–450)
RBC: 4.63 x10E6/uL (ref 4.14–5.80)
RDW: 13.9 % (ref 11.6–15.4)
WBC: 5.1 10*3/uL (ref 3.4–10.8)

## 2022-07-26 LAB — LIPID PANEL
Chol/HDL Ratio: 3.3 ratio (ref 0.0–5.0)
Cholesterol, Total: 141 mg/dL (ref 100–199)
HDL: 43 mg/dL (ref >39)
LDL Chol Calc (NIH): 79 mg/dL (ref 0–99)
Triglycerides: 103 mg/dL (ref 0–149)
VLDL Cholesterol Cal: 19 mg/dL (ref 5–40)

## 2022-07-26 LAB — COMPREHENSIVE METABOLIC PANEL
ALT: 21 IU/L (ref 0–44)
AST: 23 IU/L (ref 0–40)
Albumin/Globulin Ratio: 1.5 (ref 1.2–2.2)
Albumin: 4.3 g/dL (ref 3.8–4.8)
Alkaline Phosphatase: 77 IU/L (ref 44–121)
BUN/Creatinine Ratio: 13 (ref 10–24)
BUN: 16 mg/dL (ref 8–27)
Bilirubin Total: 0.4 mg/dL (ref 0.0–1.2)
CO2: 25 mmol/L (ref 20–29)
Calcium: 9.9 mg/dL (ref 8.6–10.2)
Chloride: 101 mmol/L (ref 96–106)
Creatinine, Ser: 1.21 mg/dL (ref 0.76–1.27)
Globulin, Total: 2.8 g/dL (ref 1.5–4.5)
Glucose: 135 mg/dL — ABNORMAL HIGH (ref 70–99)
Potassium: 4.2 mmol/L (ref 3.5–5.2)
Sodium: 139 mmol/L (ref 134–144)
Total Protein: 7.1 g/dL (ref 6.0–8.5)
eGFR: 63 mL/min/{1.73_m2} (ref >59)

## 2022-07-29 ENCOUNTER — Telehealth: Payer: Self-pay | Admitting: Interventional Cardiology

## 2022-07-29 NOTE — Telephone Encounter (Signed)
Left message to call office

## 2022-07-29 NOTE — Telephone Encounter (Signed)
Pt calling for lab results. He states RN spoke to his wife but he would like to speak to her himself.

## 2022-07-29 NOTE — Telephone Encounter (Signed)
I spoke with patient and reviewed recent lab results with him. He is seeing Dr Manuella Ghazi in about 3 months and will ask to have A1C checked at that time.

## 2022-07-29 NOTE — Telephone Encounter (Signed)
Patient returning call.

## 2023-05-16 NOTE — Progress Notes (Signed)
Pt declined SDOH. Pt to follow up with PCP

## 2023-06-27 ENCOUNTER — Encounter: Payer: Self-pay | Admitting: *Deleted

## 2023-06-27 NOTE — Progress Notes (Signed)
 Pt attended 05/16/2023 screening event where his b/p was 158/94. At the event, the pt noted having a PCP, having insurance, not smoking, and did not identify any SDOH insecurities. Chart review confirms pt's PCP of record id Dr. Leni Fairly at [facility] on [address], whose visit encounters are not visible in [facility]. During initial event f/u call, pt noted he has not seen Dr. Fairly since the 05/16/23 event but he does see his PCP on a regular basis and pt noted he does check his own b/p at home. Pt stated his recent home gb/p have been in the 120's, over 83-85... I just have to remember to take my medicine in the morning and not drink too much coffee. Pt reminded to f/u with Dr. Fairly and let him know event results and to continue to monitor at home to let his PCP know those results also. Team member and pt also discussed decaf options for coffee and checking with pharmacist before taking OTC meds for current cold symptoms, since some cold medicines can affect b/p.  Pt thanked health equity team member for checking in on him. No additional health equity team support indicated at this time.

## 2023-07-21 ENCOUNTER — Other Ambulatory Visit: Payer: Self-pay | Admitting: Interventional Cardiology

## 2024-03-11 NOTE — Progress Notes (Signed)
 Cardiology Office Note:   Date:  03/11/2024  ID:  Benjamin Vega, DOB Apr 26, 1951, MRN [id number] PCP: [name], MD  [facility] Providers Cardiologist:  Georganna Archer, MD { Chief Complaint:  Chief Complaint  Patient presents with   Shortness of Breath      History of Present Illness:   Benjamin Vega is a 73 y.o. male with a PMH of HTN, HLD and obesity who presents for follow up.  Mr. [name] presents today for follow-up and reports having ongoing SOB.  He reports that 6 months ago he noted that he was becoming more short of breath while doing his daily walks.  He walks 2 miles 6 days/week, but becomes more SOB than usual.  Additionally, this past weekend he had 2 episodes that were concerning to him.  He works at a [employer] and when he proceeded to pick up a trash can he developed acute onset SOB, diaphoresis, palpitations, and nausea.  It was concerning enough that he went home and notified his wife.  As he proceeded to rest his symptoms got better.  He went back to work the next day and the exact same thing happened.  He denies chest pain, presyncope, syncope, swelling, PND, and orthopnea.  He has never had these symptoms before now.  He denies tobacco and illicit drug use.  His family history is notable for his sister who has had 2 MIs.  He reports excellent adherence to his medications without side effects.  Past Medical History:  Diagnosis Date   Edema    Erectile dysfunction    HTN (hypertension)    Hypercholesteremia    Hyperlipemia      Studies Reviewed:    EKG:  EKG Interpretation Date/Time:  Friday March 12 2024 11:09:28 EDT Ventricular Rate:  72 PR Interval:  200 QRS Duration:  86 QT Interval:  392 QTC Calculation: 429 R Axis:   32  Text Interpretation: Sinus rhythm with occasional Premature ventricular complexes When compared with ECG of 05-Apr-2008 09:02, Premature ventricular complexes are now Present Confirmed by Archer Georganna  [phone]) on 03/12/2024 11:37:13 AM     Cardiac Studies & Procedures   ______________________________________________________________________________________________   STRESS TESTS  MYOCARDIAL PERFUSION IMAGING 02/04/2018  Interpretation Summary  Nuclear stress EF: 69%. Normal wall motion.  There was no ST segment deviation noted during stress.  There were no perfusion defects identified.  This is a low risk study.  [name], MD   ECHOCARDIOGRAM  ECHOCARDIOGRAM COMPLETE 02/04/2018  Narrative *[facility]* [address] [place], [place] [address] [phone]  ------------------------------------------------------------------- Transthoracic Echocardiography  Patient:    Benjamin, Vega MR #:       [id number] Study Date: 02/04/2018 Gender:     M Age:        73 Height:     170.2 cm Weight:     98.4 kg BSA:        2.19 m^2 Pt. Status: Room:  SONOGRAPHER  Shanda Boers, RDCS ATTENDING    Parthenia Olivia HERO ORDERING     Parthenia Olivia M REFERRING    Parthenia Olivia HERO PERFORMING   [facility], Outpatient  cc:  ------------------------------------------------------------------- LV EF: 60% -   65%  ------------------------------------------------------------------- Indications:      (R06.09). GLS -18.6  ------------------------------------------------------------------- History:   PMH:   Tachycardia, exertional dyspnea, and bilateral lower extremity edema.  Risk factors:  Hypertension. Obese. Dyslipidemia.  ------------------------------------------------------------------- Study Conclusions  - Left ventricle: The cavity size  was normal. There was mild focal basal hypertrophy of the septum. Systolic function was normal. The estimated ejection fraction was in the range of 60% to 65%. Wall motion was normal; there were no regional wall motion abnormalities. Doppler parameters are consistent with abnormal left ventricular relaxation (grade 1 diastolic  dysfunction).  ------------------------------------------------------------------- Study data:  No prior study was available for comparison.  Study status:  Routine.  Procedure:  The patient reported no pain pre or post test. Transthoracic echocardiography for diagnosis, for left ventricular function evaluation, for right ventricular function evaluation, and for assessment of valvular function. Image quality was adequate.  Study completion:  There were no complications. Transthoracic echocardiography. M-mode, complete 2D, spectral Doppler, Strain and color Doppler.  Birthdate:  Patient birthdate: 22-Sep-1950.  Age:  Patient is 73 yr old.  Sex:  Gender: male. BMI: 34 kg/m^2.  Blood pressure:     122/57  Patient status: Outpatient.  Study date:  Study date: 02/04/2018. Study time: 07:40 AM.  Location:  [facility]  -------------------------------------------------------------------  ------------------------------------------------------------------- Left ventricle:  The cavity size was normal. There was mild focal basal hypertrophy of the septum. Systolic function was normal. The estimated ejection fraction was in the range of 60% to 65%. Wall motion was normal; there were no regional wall motion abnormalities. Doppler parameters are consistent with abnormal left ventricular relaxation (grade 1 diastolic dysfunction).  ------------------------------------------------------------------- Aortic valve:   Structurally normal valve.   Cusp separation was normal.  Doppler:  Transvalvular velocity was within the normal range. There was no stenosis. There was no regurgitation.  ------------------------------------------------------------------- Mitral valve:   Structurally normal valve.   Leaflet separation was normal.  Doppler:  Transvalvular velocity was within the normal range. There was no evidence for stenosis. There was  no regurgitation.  ------------------------------------------------------------------- Left atrium:  The atrium was normal in size.  ------------------------------------------------------------------- Right ventricle:  The cavity size was normal. Wall thickness was normal. Systolic function was normal.  ------------------------------------------------------------------- Pulmonic valve:   Poorly visualized.  Structurally normal valve. Cusp separation was normal.  Doppler:  Transvalvular velocity was within the normal range. There was no regurgitation.  ------------------------------------------------------------------- Tricuspid valve:   Structurally normal valve.   Leaflet separation was normal.  Doppler:  Transvalvular velocity was within the normal range. There was no regurgitation.  ------------------------------------------------------------------- Right atrium:  The atrium was normal in size.  ------------------------------------------------------------------- Pericardium:  The pericardium was normal in appearance.  ------------------------------------------------------------------- Systemic veins: Inferior vena cava: The vessel was normal in size. The respirophasic diameter changes were in the normal range (>= 50%), consistent with normal central venous pressure.  ------------------------------------------------------------------- Measurements  Left ventricle                         Value        Reference LV ID, ED, PLAX chordal         (L)    36.1  mm     43 - 52 LV ID, ES, PLAX chordal                23.7  mm     23 - 38 LV fx shortening, PLAX chordal         34    %      >=29 LV PW thickness, ED                    9.86  mm     --------- IVS/LV PW ratio, ED  1.14         <=1.3 Stroke volume, 2D                      108   ml     --------- Stroke volume/bsa, 2D                  49    ml/m^2 --------- LV e&', lateral                         7.72  cm/s    --------- LV E/e&', lateral                       8.67         --------- LV e&', medial                          5.44  cm/s   --------- LV E/e&', medial                        12.3         --------- LV e&', average                         6.58  cm/s   --------- LV E/e&', average                       10.17        --------- Longitudinal strain, TDI               19    %      ---------  Ventricular septum                     Value        Reference IVS thickness, ED                      11.2  mm     ---------  LVOT                                   Value        Reference LVOT ID, S                             26    mm     --------- LVOT area                              5.31  cm^2   --------- LVOT ID                                26    mm     --------- LVOT peak velocity, S                  85.7  cm/s   --------- LVOT mean velocity, S                  62.2  cm/s   --------- LVOT VTI, S  20.3  cm     --------- Stroke volume (SV), LVOT DP            107.8 ml     --------- Stroke index (SV/bsa), LVOT DP         49.1  ml/m^2 ---------  Aorta                                  Value        Reference Aortic root ID, ED                     33    mm     --------- Ascending aorta ID, A-P, S             33    mm     ---------  Left atrium                            Value        Reference LA ID, A-P, ES                         28    mm     --------- LA ID/bsa, A-P                         1.28  cm/m^2 <=2.2 LA volume, S                           48.3  ml     --------- LA volume/bsa, S                       22    ml/m^2 --------- LA volume, ES, 1-p A4C                 50.9  ml     --------- LA volume/bsa, ES, 1-p A4C             23.2  ml/m^2 --------- LA volume, ES, 1-p A2C                 44.6  ml     --------- LA volume/bsa, ES, 1-p A2C             20.3  ml/m^2 ---------  Mitral valve                           Value        Reference Mitral E-wave peak velocity             66.9  cm/s   --------- Mitral A-wave peak velocity            82    cm/s   --------- Mitral deceleration time               151   ms     150 - 230 Mitral E/A ratio, peak                 0.8          ---------  Right atrium  Value        Reference RA ID, S-I, ES                         37.1  mm     --------- RA ID, M-L, ES, A4C             (L)    28    mm     30 - 46  Right ventricle                        Value        Reference RV ID, minor axis, ED, A4C base        32.7  mm     ---------  Legend: (L)  and  (H)  mark values outside specified reference range.  ------------------------------------------------------------------- Prepared and Electronically Authenticated by  [name], M.D. 2019-08-14T11:10:19          ______________________________________________________________________________________________      Risk Assessment/Calculations:              Physical Exam:     VS:  BP 124/72   Pulse 72   Ht 5' 7 (1.702 m)   Wt 219 lb (99.3 kg)   SpO2 95%   BMI 34.30 kg/m      Wt Readings from Last 3 Encounters:  07/25/22 218 lb 6.4 oz (99.1 kg)  09/18/21 229 lb (103.9 kg)  09/04/21 229 lb (103.9 kg)     GEN: Well nourished, well developed, in no acute distress NECK: No JVD; No carotid bruits CARDIAC: RRR, no murmurs, rubs, gallops RESPIRATORY:  Clear to auscultation without rales, wheezing or rhonchi  ABDOMEN: Soft, non-tender, non-distended, normal bowel sounds EXTREMITIES:  Warm and well perfused, trace bilateral edema; No deformity, 2+ radial pulses SKIN: Many seborrheic keratoses on his back PSYCH: Normal mood and affect   Assessment & Plan Dyspnea on exertion Patient presenting with DOE and now some SOB even at rest.  Associated symptoms of diaphoresis and nausea.  These are certainly concerning for possible CAD vs CHF vs valvular issue.  He is higher risk for all the above given his multiple comorbidities.  I recommend  that we perform a coronary CTA to rule out CAD and a complete echocardiogram to assess his cardiac function.  The patient is in agreement. -CCTA -Complete echo - Follow-up in 6 months  Primary hypertension Blood pressure is at goal today.  No changes. -BMP  Hypercholesteremia Last cholesterol looked good 1 year ago.  Will check today. -Lipid panel  Seborrheic keratosis I incidentally noted that the patient has many seborrheic keratoses on his back that are asymptomatic.  He reports that they have been there for years.  Initially I was concerned about possible Leser-Trelat sign; however, he reports that they have been stable for many years and has no alarming symptoms for possible cancer.      This note was written with the assistance of a dictation microphone or AI dictation software. Please excuse any typos or grammatical errors.   Signed, Georganna Archer, MD 03/11/2024 6:09 PM    [facility]

## 2024-03-12 ENCOUNTER — Ambulatory Visit
Attending: Student in an Organized Health Care Education/Training Program | Admitting: Student in an Organized Health Care Education/Training Program

## 2024-03-12 ENCOUNTER — Encounter: Payer: Self-pay | Admitting: Student in an Organized Health Care Education/Training Program

## 2024-03-12 ENCOUNTER — Other Ambulatory Visit (HOSPITAL_COMMUNITY): Payer: Self-pay

## 2024-03-12 VITALS — BP 124/72 | HR 72 | Ht 67.0 in | Wt 219.0 lb

## 2024-03-12 DIAGNOSIS — R0609 Other forms of dyspnea: Secondary | ICD-10-CM | POA: Diagnosis present

## 2024-03-12 DIAGNOSIS — L821 Other seborrheic keratosis: Secondary | ICD-10-CM | POA: Diagnosis present

## 2024-03-12 DIAGNOSIS — I10 Essential (primary) hypertension: Secondary | ICD-10-CM | POA: Insufficient documentation

## 2024-03-12 DIAGNOSIS — E7800 Pure hypercholesterolemia, unspecified: Secondary | ICD-10-CM | POA: Insufficient documentation

## 2024-03-12 LAB — BASIC METABOLIC PANEL WITH GFR
BUN/Creatinine Ratio: 12 (ref 10–24)
BUN: 15 mg/dL (ref 8–27)
CO2: 22 mmol/L (ref 20–29)
Calcium: 10.3 mg/dL — ABNORMAL HIGH (ref 8.6–10.2)
Chloride: 101 mmol/L (ref 96–106)
Creatinine, Ser: 1.25 mg/dL (ref 0.76–1.27)
Glucose: 102 mg/dL — ABNORMAL HIGH (ref 70–99)
Potassium: 4 mmol/L (ref 3.5–5.2)
Sodium: 139 mmol/L (ref 134–144)
eGFR: 60 mL/min/1.73 (ref >59)

## 2024-03-12 LAB — LIPID PANEL
Chol/HDL Ratio: 3 ratio (ref 0.0–5.0)
Cholesterol, Total: 131 mg/dL (ref 100–199)
HDL: 43 mg/dL (ref >39)
LDL Chol Calc (NIH): 73 mg/dL (ref 0–99)
Triglycerides: 74 mg/dL (ref 0–149)
VLDL Cholesterol Cal: 15 mg/dL (ref 5–40)

## 2024-03-12 MED ORDER — METOPROLOL TARTRATE 100 MG PO TABS
100.0000 mg | ORAL_TABLET | Freq: Once | ORAL | 0 refills | Status: DC
  Filled 2024-03-12: qty 1, 1d supply, fill #0

## 2024-03-12 NOTE — Patient Instructions (Addendum)
 Medication Instructions:  ONE TIME DOSE FOR CORONARY CTA: Metoprolol  tartrate (Lopressor ) Take 1 tablet (100 mg total) by mouth once for 1 dose. Take 90-120 minutes prior to scan. Hold for SBP less than 110   *If you need a refill on your cardiac medications before your next appointment, please call your pharmacy*  Lab Work TODAY: Lipid Panel  BMP If you have labs (blood work) drawn today and your tests are completely normal, you will receive your results only by: MyChart Message (if you have MyChart) OR A paper copy in the mail If you have any lab test that is abnormal or we need to change your treatment, we will call you to review the results.  Testing/Procedures: ECHOCARDIOGRAM Your physician has requested that you have an echocardiogram. Echocardiography is a painless test that uses sound waves to create images of your heart. It provides your doctor with information about the size and shape of your heart and how well your heart's chambers and valves are working. This procedure takes approximately one hour. There are no restrictions for this procedure. Please do NOT wear cologne, perfume, aftershave, or lotions (deodorant is allowed). Please arrive 15 minutes prior to your appointment time.  Please note: We ask at that you not bring children with you during ultrasound (echo/ vascular) testing. Due to room size and safety concerns, children are not allowed in the ultrasound rooms during exams. Our front office staff cannot provide observation of children in our lobby area while testing is being conducted. An adult accompanying a patient to their appointment will only be allowed in the ultrasound room at the discretion of the ultrasound technician under special circumstances. We apologize for any inconvenience.   Coronary CTA Your physician has requested that you have cardiac CT. Cardiac computed tomography (CT) is a painless test that uses an x-ray machine to take clear, detailed pictures of  your heart. For further information please visit [email or web address] Please follow instruction sheet as given   Follow-Up: At [facility], you and your health needs are our priority.  As part of our continuing mission to provide you with exceptional heart care, our providers are all part of one team.  This team includes your primary Cardiologist (physician) and Advanced Practice Providers or APPs (Physician Assistants and Nurse Practitioners) who all work together to provide you with the care you need, when you need it.  Your next appointment:   6 Months  Provider:   Georganna Archer, MD    We recommend signing up for the patient portal called MyChart.  Sign up information is provided on this After Visit Summary.  MyChart is used to connect with patients for Virtual Visits (Telemedicine).  Patients are able to view lab/test results, encounter notes, upcoming appointments, etc.  Non-urgent messages can be sent to your provider as well.   To learn more about what you can do with MyChart, go to [email or web address].   Other Instructions    Your cardiac CT will be scheduled at one of the below locations:   [facility] [address]  [place], [place] [address] [phone]  If scheduled at the Heart and V[facility] Tower at [facility] street, please enter the parking lot using the [address] entrance and use the FREE valet service at the patient drop-off area. Enter the building and check-in with registration on the main floor.  Please follow these instructions carefully (unless otherwise directed):  An IV will be required for this test and Nitroglycerin  will be given.  Hold all erectile dysfunction medications at least 3 days (72 hrs) prior to test. (Ie viagra , cialis , sildenafil , tadalafil , etc)   On the Night Before the Test: Be sure to Drink plenty of water. Do not consume any caffeinated/decaffeinated beverages or chocolate 12 hours  prior to your test. Do not take any antihistamines 12 hours prior to your test.  On the Day of the Test: Drink plenty of water until 1 hour prior to the test. Do not eat any food 1 hour prior to test. You may take your regular medications prior to the test.  Take metoprolol  (Lopressor ) 100 mg two hours prior to test. If you take Furosemide/Hydrochlorothiazide/Spironolactone /Chlorthalidone, please HOLD on the morning of the test. Patients who wear a continuous glucose monitor MUST remove the device prior to scanning. FEMALES- please wear underwire-free bra if available, avoid dresses & tight clothing  After the Test: Drink plenty of water. After receiving IV contrast, you may experience a mild flushed feeling. This is normal. On occasion, you may experience a mild rash up to 24 hours after the test. This is not dangerous. If this occurs, you can take Benadryl 25 mg, Zyrtec, Claritin, or Allegra and increase your fluid intake. (Patients taking Tikosyn should avoid Benadryl, and may take Zyrtec, Claritin, or Allegra) If you experience trouble breathing, this can be serious. If it is severe call 911 IMMEDIATELY. If it is mild, please call our office.  We will call to schedule your test 2-4 weeks out understanding that some insurance companies will need an authorization prior to the service being performed.   For more information and frequently asked questions, please visit our website : [email or web address]  For non-scheduling related questions, please contact the cardiac imaging nurse navigator should you have any questions/concerns: Cardiac Imaging Nurse Navigators Direct Office Dial: [phone]   For scheduling needs, including cancellations and rescheduling, please call [place], [phone].

## 2024-03-12 NOTE — Assessment & Plan Note (Signed)
 Patient presenting with DOE and now some SOB even at rest.  Associated symptoms of diaphoresis and nausea.  These are certainly concerning for possible CAD vs CHF vs valvular issue.  He is higher risk for all the above given his multiple comorbidities.  I recommend that we perform a coronary CTA to rule out CAD and a complete echocardiogram to assess his cardiac function.  The patient is in agreement. -CCTA -Complete echo - Follow-up in 6 months

## 2024-03-12 NOTE — Assessment & Plan Note (Signed)
 Blood pressure is at goal today.  No changes. -BMP

## 2024-03-12 NOTE — Assessment & Plan Note (Signed)
 Last cholesterol looked good 1 year ago.  Will check today. -Lipid panel

## 2024-03-15 ENCOUNTER — Ambulatory Visit: Payer: Self-pay | Admitting: Student in an Organized Health Care Education/Training Program

## 2024-03-16 NOTE — Progress Notes (Signed)
 Letter mailed

## 2024-03-17 ENCOUNTER — Ambulatory Visit (HOSPITAL_COMMUNITY)
Admission: RE | Admit: 2024-03-17 | Discharge: 2024-03-17 | Disposition: A | Discharge status: Home / Self Care | Source: Ambulatory Visit | Attending: Student in an Organized Health Care Education/Training Program | Admitting: Student in an Organized Health Care Education/Training Program

## 2024-03-17 DIAGNOSIS — R0609 Other forms of dyspnea: Secondary | ICD-10-CM | POA: Insufficient documentation

## 2024-03-17 DIAGNOSIS — I25119 Atherosclerotic heart disease of native coronary artery with unspecified angina pectoris: Secondary | ICD-10-CM | POA: Diagnosis not present

## 2024-03-17 DIAGNOSIS — I2510 Atherosclerotic heart disease of native coronary artery without angina pectoris: Secondary | ICD-10-CM | POA: Diagnosis not present

## 2024-03-17 DIAGNOSIS — Q2112 Patent foramen ovale: Secondary | ICD-10-CM | POA: Insufficient documentation

## 2024-03-17 MED ORDER — IOHEXOL 350 MG/ML SOLN
100.0000 mL | Freq: Once | INTRAVENOUS | Status: AC | PRN
  Administered 2024-03-17: 100 mL via INTRAVENOUS

## 2024-03-17 MED ORDER — NITROGLYCERIN 0.4 MG SL SUBL
0.8000 mg | SUBLINGUAL_TABLET | Freq: Once | SUBLINGUAL | Status: AC
  Administered 2024-03-17: 0.8 mg via SUBLINGUAL

## 2024-03-19 NOTE — Telephone Encounter (Signed)
 Pt returning call. Please advise.

## 2024-03-25 ENCOUNTER — Telehealth: Payer: Self-pay | Admitting: Student in an Organized Health Care Education/Training Program

## 2024-03-25 DIAGNOSIS — I2699 Other pulmonary embolism without acute cor pulmonale: Secondary | ICD-10-CM

## 2024-03-25 DIAGNOSIS — I2694 Multiple subsegmental pulmonary emboli without acute cor pulmonale: Secondary | ICD-10-CM

## 2024-03-25 MED ORDER — APIXABAN 5 MG PO TABS: ORAL_TABLET | ORAL | 0 refills | Status: DC

## 2024-03-25 NOTE — Telephone Encounter (Signed)
 Left message for patient to call back on both numbers listed for pt.

## 2024-03-25 NOTE — Telephone Encounter (Signed)
    Spoke with pt regarding results of his results. Pt made aware that he has pulmonary emboli noted on his CT scan. Made pt aware that this was discussed with Dr. Loni (DOD). Recommendations discussed with pt for Eliquis 10mg  BID for 7 days then 5mg  BID then after, repeat CBC next week, lower extremity venous dopplers and office visit scheduled for next week. Pt verbalizes understanding. Office visit scheduled for 10/7 @ 3:20pm. Prescription sent to pt preferred pharmacy. ED precautions discussed with pt. No further questions at this time.

## 2024-03-25 NOTE — Telephone Encounter (Signed)
 I reviewed the radiology report for his recent CCTA which reports that he has PE. I reviewed the CT images as well and see the reported filling defects. The DOD was notified earlier in the day and he was prescribed VTE treatment dose Apixaban with load. I called the patient to check on him. He says that he has ongoing DOE, but no chest pain or SOB at rest. I will order a dedicated CT PE for him to perform as an outpatient to better define the extent of his PE. The patient is agreeable to this plan. He will follow up with me on 10/7.  Dr. Georganna Archer

## 2024-03-25 NOTE — Telephone Encounter (Signed)
 Critical finding on CT Scan

## 2024-03-26 ENCOUNTER — Telehealth: Payer: Self-pay | Admitting: Pharmacy Technician

## 2024-03-26 ENCOUNTER — Ambulatory Visit (HOSPITAL_COMMUNITY)
Admission: RE | Admit: 2024-03-26 | Discharge: 2024-03-26 | Disposition: A | Discharge status: Home / Self Care | Source: Ambulatory Visit | Attending: Internal Medicine | Admitting: Internal Medicine

## 2024-03-26 DIAGNOSIS — I2699 Other pulmonary embolism without acute cor pulmonale: Secondary | ICD-10-CM | POA: Diagnosis present

## 2024-03-26 NOTE — Telephone Encounter (Signed)
 Noted

## 2024-03-26 NOTE — Telephone Encounter (Signed)
   He paid 428.80 for 30 days  No cardiomyopathy/no heart failure so does not qualify for grant

## 2024-03-26 NOTE — Telephone Encounter (Signed)
 Pt is needing a CT PE, thank you.

## 2024-03-29 NOTE — Telephone Encounter (Signed)
CT is scheduled for tomorrow.

## 2024-03-30 ENCOUNTER — Encounter: Payer: Self-pay | Admitting: Student in an Organized Health Care Education/Training Program

## 2024-03-30 ENCOUNTER — Ambulatory Visit: Admitting: Student in an Organized Health Care Education/Training Program

## 2024-03-30 ENCOUNTER — Ambulatory Visit
Admission: RE | Admit: 2024-03-30 | Discharge: 2024-03-30 | Disposition: A | Discharge status: Home / Self Care | Source: Ambulatory Visit | Attending: Student in an Organized Health Care Education/Training Program | Admitting: Student in an Organized Health Care Education/Training Program

## 2024-03-30 ENCOUNTER — Other Ambulatory Visit (HOSPITAL_COMMUNITY): Payer: Self-pay

## 2024-03-30 ENCOUNTER — Telehealth: Payer: Self-pay | Admitting: Student in an Organized Health Care Education/Training Program

## 2024-03-30 VITALS — BP 125/79 | HR 93 | Ht 67.0 in | Wt 214.0 lb

## 2024-03-30 DIAGNOSIS — I2694 Multiple subsegmental pulmonary emboli without acute cor pulmonale: Secondary | ICD-10-CM | POA: Diagnosis present

## 2024-03-30 DIAGNOSIS — K769 Liver disease, unspecified: Secondary | ICD-10-CM | POA: Insufficient documentation

## 2024-03-30 DIAGNOSIS — I700 Atherosclerosis of aorta: Secondary | ICD-10-CM | POA: Diagnosis not present

## 2024-03-30 DIAGNOSIS — E7800 Pure hypercholesterolemia, unspecified: Secondary | ICD-10-CM | POA: Diagnosis not present

## 2024-03-30 DIAGNOSIS — I2699 Other pulmonary embolism without acute cor pulmonale: Secondary | ICD-10-CM | POA: Diagnosis not present

## 2024-03-30 DIAGNOSIS — R931 Abnormal findings on diagnostic imaging of heart and coronary circulation: Secondary | ICD-10-CM | POA: Diagnosis not present

## 2024-03-30 DIAGNOSIS — Z86711 Personal history of pulmonary embolism: Secondary | ICD-10-CM | POA: Insufficient documentation

## 2024-03-30 MED ORDER — IOHEXOL 350 MG/ML SOLN
80.0000 mL | Freq: Once | INTRAVENOUS | Status: AC | PRN
  Administered 2024-03-30: 80 mL via INTRAVENOUS

## 2024-03-30 MED ORDER — ATORVASTATIN CALCIUM 20 MG PO TABS: 20.0000 mg | ORAL_TABLET | Freq: Every day | ORAL | 3 refills | Status: DC

## 2024-03-30 NOTE — Assessment & Plan Note (Signed)
 The patient takes a baby aspirin  for CAC.  Given that he is starting therapeutic anticoagulation, he can discontinue his baby aspirin . -Stop baby aspirin 

## 2024-03-30 NOTE — Assessment & Plan Note (Signed)
 His LDL is slightly above 70, but with his coronary calcium  I would recommend being less than 70.  Will increase his atorvastatin  to 20 mg daily -Increase atorvastatin  to 20 mg daily

## 2024-03-30 NOTE — Assessment & Plan Note (Signed)
 Patient incidentally found to have pulmonary emboli on coronary CTA which explains why he has been feeling DOE.  His PEs appear to be unprovoked as he has no prior history of hypercoagulability or recent provoking factors.  The patient was started on Eliquis for anticoagulation, but unfortunately this is cost prohibitive for him.  He has a 30-day supply and I was able to find Pradaxa on [facility] for much less.  The plan will be for him to continue his 30-day supply of Eliquis and then notify us  when he is close to running out so that we can transition to a more affordable DOAC like Pradaxa through [facility].  I reviewed his chart and he had a colonoscopy in 2023 which showed multiple polyps but no active malignancy.  He says that he had PSA checked several years ago and it was normal, but I do not have record of this.  Whenever someone has an unprovoked PE I do worry about malignancy, so I will refer him to hematology/oncology to evaluate for unprovoked PE as appropriate. -Continue Eliquis for now -Will plan to transition to a different DOAC that is more affordable once he is close to running out of Eliquis -Referred to hematology/oncology for workup of unprovoked PE - Follow-up in 3 months

## 2024-03-30 NOTE — Progress Notes (Signed)
 Cardiology Office Note:   Date:  03/30/2024  ID:  Benjamin Vega, Benjamin Vega 03/29/1951, MRN [id number] PCP: [name], MD  [facility] Providers Cardiologist:  Georganna Archer, MD { Chief Complaint:  Chief Complaint  Patient presents with   Shortness of Breath      History of Present Illness:   Benjamin Vega is a 73 y.o. male with a PMH of HTN, HLD, obesity and recently diagnosed acute PE who presents for follow up.   Last time I saw the patient he reported DOE and chest pain prompting evaluation by CCTA.  CCTA was negative for obstructive coronary disease, but revealed bilateral PEs.  No provoking factors for his PEs that he or I can identify.  The patient was started on Eliquis which has been cost prohibitive.  Bilateral lower extremity ultrasounds were negative for DVT.  CT PE today confirmed the presence of extensive PEs as well as cysts within his liver.  Today the patient states that he still has DOE, but otherwise feels okay.  Denies recent travel or surgeries.  The Eliquis is giving him some nausea but no bleeding.  No further complaints.   Past Medical History:  Diagnosis Date   Edema    Erectile dysfunction    HTN (hypertension)    Hypercholesteremia    Hyperlipemia      Studies Reviewed:    EKG: No new ECG       Cardiac Studies & Procedures   ______________________________________________________________________________________________   STRESS TESTS  MYOCARDIAL PERFUSION IMAGING 02/04/2018  Interpretation Summary  Nuclear stress EF: 69%. Normal wall motion.  There was no ST segment deviation noted during stress.  There were no perfusion defects identified.  This is a low risk study.  [name], MD   ECHOCARDIOGRAM  ECHOCARDIOGRAM COMPLETE 02/04/2018  Narrative *[facility]* [address] [place], [place] [address] [phone]  ------------------------------------------------------------------- Transthoracic  Echocardiography  Patient:    Benjamin, Vega MR #:       [id number] Study Date: 02/04/2018 Gender:     M Age:        49 Height:     170.2 cm Weight:     98.4 kg BSA:        2.19 m^2 Pt. Status: Room:  SONOGRAPHER  Shanda Boers, RDCS ATTENDING    Parthenia Olivia HERO ORDERING     Parthenia Olivia M REFERRING    Parthenia Olivia HERO PERFORMING   [facility], Outpatient  cc:  ------------------------------------------------------------------- LV EF: 60% -   65%  ------------------------------------------------------------------- Indications:      (R06.09). GLS -18.6  ------------------------------------------------------------------- History:   PMH:   Tachycardia, exertional dyspnea, and bilateral lower extremity edema.  Risk factors:  Hypertension. Obese. Dyslipidemia.  ------------------------------------------------------------------- Study Conclusions  - Left ventricle: The cavity size was normal. There was mild focal basal hypertrophy of the septum. Systolic function was normal. The estimated ejection fraction was in the range of 60% to 65%. Wall motion was normal; there were no regional wall motion abnormalities. Doppler parameters are consistent with abnormal left ventricular relaxation (grade 1 diastolic dysfunction).  ------------------------------------------------------------------- Study data:  No prior study was available for comparison.  Study status:  Routine.  Procedure:  The patient reported no pain pre or post test. Transthoracic echocardiography for diagnosis, for left ventricular function evaluation, for right ventricular function evaluation, and for assessment of valvular function. Image quality was adequate.  Study completion:  There were no complications. Transthoracic echocardiography. M-mode, complete 2D, spectral  Doppler, Strain and color Doppler.  Birthdate:  Patient birthdate: [birth year].  Age:  Patient is 73 yr old.  Sex:  Gender: male. BMI: 34 kg/m^2.   Blood pressure:     122/57  Patient status: Outpatient.  Study date:  Study date: 02/04/2018. Study time: 07:40 AM.  Location:  [facility]  -------------------------------------------------------------------  ------------------------------------------------------------------- Left ventricle:  The cavity size was normal. There was mild focal basal hypertrophy of the septum. Systolic function was normal. The estimated ejection fraction was in the range of 60% to 65%. Wall motion was normal; there were no regional wall motion abnormalities. Doppler parameters are consistent with abnormal left ventricular relaxation (grade 1 diastolic dysfunction).  ------------------------------------------------------------------- Aortic valve:   Structurally normal valve.   Cusp separation was normal.  Doppler:  Transvalvular velocity was within the normal range. There was no stenosis. There was no regurgitation.  ------------------------------------------------------------------- Mitral valve:   Structurally normal valve.   Leaflet separation was normal.  Doppler:  Transvalvular velocity was within the normal range. There was no evidence for stenosis. There was no regurgitation.  ------------------------------------------------------------------- Left atrium:  The atrium was normal in size.  ------------------------------------------------------------------- Right ventricle:  The cavity size was normal. Wall thickness was normal. Systolic function was normal.  ------------------------------------------------------------------- Pulmonic valve:   Poorly visualized.  Structurally normal valve. Cusp separation was normal.  Doppler:  Transvalvular velocity was within the normal range. There was no regurgitation.  ------------------------------------------------------------------- Tricuspid valve:   Structurally normal valve.   Leaflet separation was normal.  Doppler:  Transvalvular velocity  was within the normal range. There was no regurgitation.  ------------------------------------------------------------------- Right atrium:  The atrium was normal in size.  ------------------------------------------------------------------- Pericardium:  The pericardium was normal in appearance.  ------------------------------------------------------------------- Systemic veins: Inferior vena cava: The vessel was normal in size. The respirophasic diameter changes were in the normal range (>= 50%), consistent with normal central venous pressure.  ------------------------------------------------------------------- Measurements  Left ventricle                         Value        Reference LV ID, ED, PLAX chordal         (L)    36.1  mm     43 - 52 LV ID, ES, PLAX chordal                23.7  mm     23 - 38 LV fx shortening, PLAX chordal         34    %      >=29 LV PW thickness, ED                    9.86  mm     --------- IVS/LV PW ratio, ED                    1.14         <=1.3 Stroke volume, 2D                      108   ml     --------- Stroke volume/bsa, 2D                  49    ml/m^2 --------- LV e&', lateral                         7.72  cm/s   ---------  LV E/e&', lateral                       8.67         --------- LV e&', medial                          5.44  cm/s   --------- LV E/e&', medial                        12.3         --------- LV e&', average                         6.58  cm/s   --------- LV E/e&', average                       10.17        --------- Longitudinal strain, TDI               19    %      ---------  Ventricular septum                     Value        Reference IVS thickness, ED                      11.2  mm     ---------  LVOT                                   Value        Reference LVOT ID, S                             26    mm     --------- LVOT area                              5.31  cm^2   --------- LVOT ID                                 26    mm     --------- LVOT peak velocity, S                  85.7  cm/s   --------- LVOT mean velocity, S                  62.2  cm/s   --------- LVOT VTI, S                            20.3  cm     --------- Stroke volume (SV), LVOT DP            107.8 ml     --------- Stroke index (SV/bsa), LVOT DP         49.1  ml/m^2 ---------  Aorta                                  Value  Reference Aortic root ID, ED                     33    mm     --------- Ascending aorta ID, A-P, S             33    mm     ---------  Left atrium                            Value        Reference LA ID, A-P, ES                         28    mm     --------- LA ID/bsa, A-P                         1.28  cm/m^2 <=2.2 LA volume, S                           48.3  ml     --------- LA volume/bsa, S                       22    ml/m^2 --------- LA volume, ES, 1-p A4C                 50.9  ml     --------- LA volume/bsa, ES, 1-p A4C             23.2  ml/m^2 --------- LA volume, ES, 1-p A2C                 44.6  ml     --------- LA volume/bsa, ES, 1-p A2C             20.3  ml/m^2 ---------  Mitral valve                           Value        Reference Mitral E-wave peak velocity            66.9  cm/s   --------- Mitral A-wave peak velocity            82    cm/s   --------- Mitral deceleration time               151   ms     150 - 230 Mitral E/A ratio, peak                 0.8          ---------  Right atrium                           Value        Reference RA ID, S-I, ES                         37.1  mm     --------- RA ID, M-L, ES, A4C             (L)    28    mm     30 - 46  Right ventricle  Value        Reference RV ID, minor axis, ED, A4C base        32.7  mm     ---------  Legend: (L)  and  (H)  mark values outside specified reference range.  ------------------------------------------------------------------- Prepared and Electronically Authenticated by  [name],  M.D. 2019-08-14T11:10:19      CT SCANS  CT CORONARY MORPH W/CTA COR W/SCORE 03/17/2024  Addendum 03/25/2024  4:19 PM ADDENDUM REPORT: 03/25/2024 16:16  EXAM: OVER-READ INTERPRETATION  CT CHEST  The following report is an over-read performed by radiologist Dr. Andrea Gasman of [facility] on 03/25/2024. This over-read does not include interpretation of cardiac or coronary anatomy or pathology. The coronary CTA interpretation by the cardiologist is attached.  COMPARISON:  None.  FINDINGS: Vascular: The included aorta is normal in caliber. There are multiple apparent filling defects within the pulmonary arteries in the right lung, for example series 303, image 30 right upper lobe. Right middle and lower lobe series 303 image 64 and 84.  Mediastinum/nodes: No adenopathy or mass. Unremarkable esophagus.  Lungs: No focal airspace disease. No pulmonary nodule. No pleural fluid. The included airways are patent.  Upper abdomen: No acute findings.  Multiple cysts in the liver.  Musculoskeletal: There are no acute or suspicious osseous abnormalities.  IMPRESSION: Multiple apparent filling defects within the pulmonary arteries in the right lung, suspicious for pulmonary emboli. Recommend dedicated CTA of the chest for confirmation.  These results will be called to the ordering clinician or representative by the Radiologist Assistant, and communication documented in the PACS or [employer].   Electronically Signed By: Andrea Gasman M.D. On: 03/25/2024 16:16  Narrative CLINICAL DATA:  This is a 73 year old male with anginal symptoms  EXAM: Cardiac/Coronary  CTA  TECHNIQUE: The patient was scanned on a [employer].  FINDINGS: A 100 kV prospective scan was triggered in the descending thoracic aorta at 111 HU's. Axial non-contrast 3 mm slices were carried out through the heart. The data set was analyzed on a dedicated work station and  scored using the Agatson method. Gantry rotation speed was 250 msecs and collimation was .6 mm. No beta blockade and 0.8 mg of sl NTG was given. The 3D data set was reconstructed in 5% intervals of the 67-82 % of the R-R cycle. Diastolic phases were analyzed on a dedicated work station using MPR, MIP and VRT modes. The patient received 80 cc of contrast.  Aorta:  Normal Size.  No calcifications.  No dissection.  Aortic Valve:  Trileaflet.  No calcifications.  Coronary Arteries:  Normal coronary origin.  Right dominance.  RCA is a large dominant artery that gives rise to PDA and PLA. There is minimal (<25%) focal calcified plaque in the proximal RCA.  Left main is a large artery that gives rise to LAD and LCX arteries.  LAD is a large vessel. There is focal mild (25-49%) calcified plaque in the proximal LAD. The mid LAD with diffuse minimal soft plaques. The distal LAD with no plaques  D1 and D2 with no apparent plaques.  LCX is a non-dominant artery that gives rise to one large OM1 branch. There is focal mild calcified plaque in the mid LCX artery.  Coronary Calcium  Score:  Left main: 0  Left anterior descending artery: 36.7  Left circumflex artery: 11.4  Right coronary artery: 28.6  Total: 76.7  Percentile: 52  Other findings:  Normal pulmonary vein drainage into the left  atrium.  Normal left atrial appendage without a thrombus.  Normal size of the pulmonary artery.  Very small patent foramen ovale  IMPRESSION: 1. Coronary calcium  score of 76.7. This was 22 percentile for age and sex matched control.  2. Normal coronary origin with right dominance.  3. CAD-RADS 2. Mild non-obstructive CAD (25-49%). Consider non-atherosclerotic causes of chest pain. Consider preventive therapy and risk factor modification.  4. Very small patent foramen ovale  The noncardiac portion of this study will be interpreted in separate report by the  radiologist.  Electronically Signed: By: Kardie  Tobb D.O. On: 03/18/2024 15:19     ______________________________________________________________________________________________      Risk Assessment/Calculations:              Physical Exam:     VS:  BP 125/79 (BP Location: Left Arm, Patient Position: Sitting, Cuff Size: Large)   Pulse 93   Ht 5' 7 (1.702 m)   Wt 214 lb (97.1 kg)   SpO2 98%   BMI 33.52 kg/m      Wt Readings from Last 3 Encounters:  03/30/24 214 lb (97.1 kg)  03/12/24 219 lb (99.3 kg)  07/25/22 218 lb 6.4 oz (99.1 kg)     GEN: Well nourished, well developed, in no acute distress NECK: No JVD; No carotid bruits CARDIAC: RRR, no murmurs, rubs, gallops RESPIRATORY:  Clear to auscultation without rales, wheezing or rhonchi  ABDOMEN: Soft, non-tender, non-distended, normal bowel sounds EXTREMITIES:  Warm and well perfused, no edema; No deformity, 2+ radial pulses PSYCH: Normal mood and affect   Assessment & Plan Acute pulmonary embolism without acute cor pulmonale, unspecified pulmonary embolism type [facility]) Patient incidentally found to have pulmonary emboli on coronary CTA which explains why he has been feeling DOE.  His PEs appear to be unprovoked as he has no prior history of hypercoagulability or recent provoking factors.  The patient was started on Eliquis for anticoagulation, but unfortunately this is cost prohibitive for him.  He has a 30-day supply and I was able to find Pradaxa on [facility] for much less.  The plan will be for him to continue his 30-day supply of Eliquis and then notify us  when he is close to running out so that we can transition to a more affordable DOAC like Pradaxa through [facility].  I reviewed his chart and he had a colonoscopy in 2023 which showed multiple polyps but no active malignancy.  He says that he had PSA checked several years ago and it was normal, but I do not have record of this.  Whenever someone has an unprovoked PE I do  worry about malignancy, so I will refer him to hematology/oncology to evaluate for unprovoked PE as appropriate. -Continue Eliquis for now -Will plan to transition to a different DOAC that is more affordable once he is close to running out of Eliquis -Referred to hematology/oncology for workup of unprovoked PE - Follow-up in 3 months  Hepatic lesion Noted to have multiple large hepatic lesions on CT imaging which were interpreted as being cystic.  In light of his recently developed unprovoked PEs, I want to refer to GI to see if additional assessment and rule out of malignancy is warranted. -Referred to GI Hypercholesteremia His LDL is slightly above 70, but with his coronary calcium  I would recommend being less than 70.  Will increase his atorvastatin  to 20 mg daily -Increase atorvastatin  to 20 mg daily  Elevated coronary artery calcium  score The patient takes a baby aspirin  for  CAC.  Given that he is starting therapeutic anticoagulation, he can discontinue his baby aspirin . -Stop baby aspirin           This note was written with the assistance of a dictation microphone or AI dictation software. Please excuse any typos or grammatical errors.   Signed, Georganna Archer, MD 03/30/2024 3:55 PM    [facility]

## 2024-03-30 NOTE — Telephone Encounter (Signed)
 Radiology calling with critical finding for pt. Call transferred to Dr. Floretta, DOD 2.

## 2024-03-30 NOTE — Assessment & Plan Note (Signed)
 Noted to have multiple large hepatic lesions on CT imaging which were interpreted as being cystic.  In light of his recently developed unprovoked PEs, I want to refer to GI to see if additional assessment and rule out of malignancy is warranted. -Referred to GI

## 2024-03-30 NOTE — Telephone Encounter (Addendum)
 xarelto would be 112.50 for 30 days and dabigatran and pradaxa both say product non formulary   Eliquis 5mg  too soon until 04/17/24    Eliquis 2.5mg  went through 114.04 for 30 days

## 2024-03-30 NOTE — Patient Instructions (Signed)
 Medication Instructions:  Your physician has recommended you make the following change in your medication:  STOP: Aspirin  INCREASE: Atorvastatin  20 mg once daily *If you need a refill on your cardiac medications before your next appointment, please call your pharmacy*   Follow-Up: At [facility], you and your health needs are our priority.  As part of our continuing mission to provide you with exceptional heart care, our providers are all part of one team.  This team includes your primary Cardiologist (physician) and Advanced Practice Providers or APPs (Physician Assistants and Nurse Practitioners) who all work together to provide you with the care you need, when you need it.  Your next appointment:   3 month(s)  Provider:   Georganna Archer, MD

## 2024-04-20 ENCOUNTER — Ambulatory Visit (HOSPITAL_COMMUNITY)
Admission: RE | Admit: 2024-04-20 | Discharge: 2024-04-20 | Disposition: A | Discharge status: Home / Self Care | Source: Ambulatory Visit | Attending: Cardiology | Admitting: Cardiology

## 2024-04-20 DIAGNOSIS — R0609 Other forms of dyspnea: Secondary | ICD-10-CM | POA: Diagnosis not present

## 2024-04-20 DIAGNOSIS — R072 Precordial pain: Secondary | ICD-10-CM | POA: Diagnosis not present

## 2024-04-20 DIAGNOSIS — R600 Localized edema: Secondary | ICD-10-CM | POA: Diagnosis not present

## 2024-04-20 DIAGNOSIS — I10 Essential (primary) hypertension: Secondary | ICD-10-CM | POA: Insufficient documentation

## 2024-04-20 LAB — ECHOCARDIOGRAM COMPLETE
Area-P 1/2: 3.81 cm2
S' Lateral: 2.8 cm

## 2024-04-23 ENCOUNTER — Other Ambulatory Visit: Payer: Self-pay | Admitting: Student in an Organized Health Care Education/Training Program

## 2024-04-26 MED ORDER — ATORVASTATIN CALCIUM 20 MG PO TABS: 20.0000 mg | ORAL_TABLET | Freq: Every day | ORAL | 3 refills | Status: AC

## 2024-04-26 NOTE — Progress Notes (Signed)
Normal letter mailed

## 2024-04-27 ENCOUNTER — Inpatient Hospital Stay

## 2024-04-27 VITALS — BP 124/80 | HR 79 | Temp 97.9°F | Resp 16 | Ht 67.0 in | Wt 214.4 lb

## 2024-04-27 DIAGNOSIS — Z800 Family history of malignant neoplasm of digestive organs: Secondary | ICD-10-CM | POA: Insufficient documentation

## 2024-04-27 DIAGNOSIS — I2699 Other pulmonary embolism without acute cor pulmonale: Secondary | ICD-10-CM | POA: Insufficient documentation

## 2024-04-27 DIAGNOSIS — R918 Other nonspecific abnormal finding of lung field: Secondary | ICD-10-CM | POA: Diagnosis not present

## 2024-04-27 DIAGNOSIS — Z7901 Long term (current) use of anticoagulants: Secondary | ICD-10-CM | POA: Diagnosis not present

## 2024-04-27 DIAGNOSIS — Z87891 Personal history of nicotine dependence: Secondary | ICD-10-CM | POA: Insufficient documentation

## 2024-04-27 NOTE — Assessment & Plan Note (Addendum)
 Continue apixaban 5 mg twice daily Refer to Lum Herald at DVT clinic for options given cost is any issue. May consider Pradaxa or Xarelto if less expensive given normal kidney function. CBC, LFT and BMP today Return in January and consider prophylactic dose in the future

## 2024-04-27 NOTE — Progress Notes (Signed)
 [facility] CONSULT NOTE  Patient Care Team: [name], MD as PCP - General (Family Medicine) Floretta Mallard, MD as PCP - Cardiology (Cardiology)   ASSESSMENT & PLAN:  73 y.o.male with history of HTN, HLD, glaucoma and PE referred to [facility] for history of acute PE.   First episode: at age 83 Setting: unprovoked Treatment: apixaban   Discussed his scenario. For patients with unprovoked VTE, the risks of recurrent VTE after completing a course of anticoagulant therapy have been estimated to be 10% by 2 years and >30% by 10 years. ASH 2020. Therefore, I do not see a reason to order excessive testing to screen for thrombophilia disorder as it would not change our management. The goal of anticoagulation therapy is long term as tolerated. He has no additional symptoms or exam finding for additional testing or imaging. He is otherwise feeling well. Assessment & Plan Other acute pulmonary embolism without acute cor pulmonale (HCC) Continue apixaban 5 mg twice daily Refer to Lum Herald at DVT clinic for options given cost is any issue. May consider Pradaxa or Xarelto if less expensive given normal kidney function. CBC, LFT and BMP today Return in January and consider prophylactic dose in the future  Pulmonary infiltrate Recommend repeat CT with PCP  Patient will communicate with PCP  Patient education for risk factors and prevention of clotting We talked about modifiable risk factors.  Prevention of clotting like deep vein thrombosis including: Strong risk factors including fractures of lower limb, hospitalization for severe illness, such as heart failure, myocardial infarction, spinal cord injury, major trauma, hip or knee replacement, and previous VTE. avoid prolonged immobilization and moving extremities every 1-2 hours during long car rides or flights.  Taking a break and moving extremities if working in a job setting with  prolonged sitting.   Avoid dehydration, especially in high altitude. Avoid cigarette smoking Maintaining healthy lifestyle to prevent development of diabetes.  Weight loss if BMI over 30.  Regular exercises but not extreme.  Stay hydrated with exercises. Other risk factors for clotting are surgery, hospitalization, inflammatory disease or severe infection, trauma or injuries from inflammatory state and stasis. If developing one-sided leg swelling, pain, color change, chest pain, sudden short of breath, difficulty taking deep breaths, taking deep breath with chest discomfort or pain, dizziness or heart racing sensation, go to the emergency room immediately for evaluation. If developing trauma, uncontrolled bleeding, such as bloody stools report ED immediately. Avoid NSAIDs, aspirin  while on blood thinner.  Patient should avoid elective surgery in the acute thrombosis period for 3 months.  Discussed bleeding precautions and avoid high risk activities for falling while on anticoagulation. Anticoagulants do not cause bleeding.  Rather, if bleeding occurs when one is on anticoagulation, it may take longer for the bleeding to stop due to reduce coagulation capacity. Report to ED immediately.   Orders Placed This Encounter  Procedures   CBC with Differential (Cancer Center Only)    Standing Status:   Future    Number of Occurrences:   1    Expiration Date:   04/27/2025   AMB Referral to Deep Vein Thrombosis Clinic    Referral Priority:   Urgent    Referral Type:   Consultation    Referral Reason:   Specialty Services Required    Number of Visits Requested:   1    All questions were answered. The patient knows to call the clinic with any problems, questions or  concerns.  Benjamin JAYSON Chihuahua, MD 11/4/20255:45 PM  CHIEF COMPLAINTS/PURPOSE OF CONSULTATION:  PE  HISTORY OF PRESENTING ILLNESS:  Benjamin Vega 73 y.o. male is here because of history of pulm embolism.  03/26/24 ultrasound showed  no evidence of bilateral lower extremity DVT.  03/30/2024 CTA showed multifocal bilateral pulmonary emboli.  Interval development of multifocal bilateral ground glass opacity possibly representing acute infection or inflammation.  Patient was started on apixaban.  Report of cost is an issue. He was on aspirin  before the diagnosis.   04/20/24 echo showed normal RV cardiac function.  Report he couldn't walk for 2 miles before the diagnosis and after started apixaban symptoms resolved a few days.  No bleeding, bloody stool.  No headaches, nausea, vomiting, chest pain, coughing, or short of breath, stomach pain, bloody stool, diarrhea, stool change, bloody urine, trouble urinating, mass/lump, night sweats.  Report of appetite went down from apixaban and lost a few pounds but not anymore. Appetite is normal now.    MEDICAL HISTORY:  Past Medical History:  Diagnosis Date   Edema    Erectile dysfunction    HTN (hypertension)    Hypercholesteremia    Hyperlipemia     SURGICAL HISTORY: Past Surgical History:  Procedure Laterality Date   COLONOSCOPY     COLONOSCOPY  09/18/2021   FOOT SURGERY      SOCIAL HISTORY: Social History   Socioeconomic History   Marital status: Married    Spouse name: Not on file   Number of children: Not on file   Years of education: Not on file   Highest education level: Not on file  Occupational History   Not on file  Tobacco Use   Smoking status: Former    Passive exposure: Never   Smokeless tobacco: Former    Types: Engineer, Drilling   Vaping status: Never Used  Substance and Sexual Activity   Alcohol use: Yes    Alcohol/week: 4.0 standard drinks of alcohol    Types: 4 Shots of liquor per week   Drug use: No   Sexual activity: Not Currently  Other Topics Concern   Not on file  Social History Narrative   Not on file   Social Drivers of Health   Financial Resource Strain: Not on file  Food Insecurity: No Food Insecurity (04/27/2024)    Hunger Vital Sign    Worried About Running Out of Food in the Last Year: Never true    Ran Out of Food in the Last Year: Never true  Transportation Needs: No Transportation Needs (04/27/2024)   PRAPARE - Administrator, Civil Service (Medical): No    Lack of Transportation (Non-Medical): No  Physical Activity: Not on file  Stress: Not on file  Social Connections: Not on file  Intimate Partner Violence: Not At Risk (04/27/2024)   Humiliation, Afraid, Rape, and Kick questionnaire    Fear of Current or Ex-Partner: No    Emotionally Abused: No    Physically Abused: No    Sexually Abused: No    FAMILY HISTORY: Family History  Problem Relation Age of Onset   Diabetes Mother    Hypertension Father    Heart disease Father    Esophageal cancer Sister    Colon cancer Neg Hx    Colon polyps Neg Hx    Stomach cancer Neg Hx    Rectal cancer Neg Hx     ALLERGIES:  has no known allergies.  MEDICATIONS:  Current Outpatient Medications  Medication Sig Dispense Refill   amLODipine  (NORVASC ) 10 MG tablet TAKE 1 TABLET DAILY 90 tablet 0   apixaban (ELIQUIS) 5 MG TABS tablet Take 2 tablets (10mg ) twice daily for 7 days, then 1 tablet (5mg ) twice daily 70 tablet 0   atorvastatin  (LIPITOR) 20 MG tablet Take 1 tablet (20 mg total) by mouth daily. 90 tablet 3   brimonidine (ALPHAGAN) 0.2 % ophthalmic solution INSTILL 1 DROP INTO AFFECTED EYE(S) BY OPHTHALMIC ROUTE EVERY 8 HOURS     carvedilol  (COREG ) 12.5 MG tablet TAKE 1 TABLET TWICE A DAY 180 tablet 0   COMBIGAN 0.2-0.5 % ophthalmic solution Place 1 drop into both eyes every 12 (twelve) hours.      spironolactone  (ALDACTONE ) 25 MG tablet TAKE 1 TABLET DAILY 90 tablet 0   timolol (TIMOPTIC) 0.5 % ophthalmic solution 1 drop 2 (two) times daily.     No current facility-administered medications for this visit.    REVIEW OF SYSTEMS:   All relevant systems were reviewed with the patient and are negative.  PHYSICAL  EXAMINATION:  Vitals:   04/27/24 1437  BP: 124/80  Pulse: 79  Resp: 16  Temp: 97.9 F (36.6 C)  SpO2: 97%   Filed Weights   04/27/24 1437  Weight: 214 lb 6.4 oz (97.3 kg)    GENERAL: alert, no distress and comfortable SKIN: skin color normal LUNGS: Effort normal and no respiratory distress.  Clear to auscultation HEART: regular rate & rhythm ABDOMEN: abdomen soft, non-tender Musculoskeletal: no edema.    LABORATORY DATA:  I have reviewed the data as listed   RADIOGRAPHIC STUDIES: I have personally reviewed the radiological images as listed and agreed with the findings in the report.

## 2024-04-27 NOTE — Assessment & Plan Note (Addendum)
 Recommend repeat CT with PCP  Patient will communicate with PCP

## 2024-04-29 ENCOUNTER — Other Ambulatory Visit: Payer: Self-pay | Admitting: Internal Medicine

## 2024-05-03 ENCOUNTER — Other Ambulatory Visit: Payer: Self-pay | Admitting: Student in an Organized Health Care Education/Training Program

## 2024-05-03 ENCOUNTER — Telehealth: Payer: Self-pay | Admitting: Student in an Organized Health Care Education/Training Program

## 2024-05-03 DIAGNOSIS — I2699 Other pulmonary embolism without acute cor pulmonale: Secondary | ICD-10-CM

## 2024-05-03 MED ORDER — DABIGATRAN ETEXILATE MESYLATE 150 MG PO CAPS: 150.0000 mg | ORAL_CAPSULE | Freq: Two times a day (BID) | ORAL | 1 refills | Status: AC

## 2024-05-03 NOTE — Telephone Encounter (Addendum)
 Eliquis 5mg  refill request received. Patient is 73 years old, weight-97.3kg, Crea-1.25 on 03/12/24, Diagnosis-PE, and last seen by Dr. Floretta on 03/30/24. Dose is appropriate based on dosing criteria. However, last OV states patient needs to call once close to being out so he can switch to Pradaxa.   Left message for patient to inquire if he is low or if the pharmacy is requesting sooner than needed and to ensure he aware of the plan to switch to Pradaxa if low on eliquis since expensive.   The patient was started on Eliquis for anticoagulation, but unfortunately this is cost prohibitive for him. He has a 30-day supply and I was able to find Pradaxa on Good[facility] for much less. The plan will be for him to continue his 30-day supply of Eliquis and then notify us  when he is close to running out so that we can transition to a more affordable DOAC like Pradaxa through [facility].  Plan: -Continue Eliquis for now -Will plan to transition to a different DOAC that is more affordable once he is close to running out of Eliquis  Pt returned call and states that he has 2 days left of the eliquis and is ready to switch to pradaxa, he confirmed that he understands how to take the medication, side effects, risks and benefits. Also, he recalls from his OV that the cost is going to be lower than eliquis.  Pradaxa  150mg  refill request received. Pt is 73 years old, weight-97.3kg, Crea-1.25 on 03/12/24, last seen by Dr. Floretta on 03/30/24, Diagnosis-PE, CrCl-70.27 mL/min; Dose is appropriate based on dosing criteria. Will send in refill to requested pharmacy.

## 2024-05-03 NOTE — Telephone Encounter (Signed)
*  STAT* If patient is at the pharmacy, call can be transferred to refill team.   1. Which medications need to be refilled? (please list name of each medication and dose if known) apixaban (ELIQUIS) 5 MG TABS tablet    2. Would you like to learn more about the convenience, safety, & potential cost savings by using the [facility]? No   3. Are you open to using the [facility] (Type [facility]) No   4. Which pharmacy/location (including street and city if local pharmacy) is medication to be sent to?  [id number] [place],  - [address]   5. Do they need a 30 day or 90 day supply? 90 day

## 2024-05-04 ENCOUNTER — Other Ambulatory Visit (HOSPITAL_COMMUNITY): Payer: Self-pay

## 2024-05-04 NOTE — Telephone Encounter (Addendum)
 Please refer to 05/03/24 refill request. Will call Pharmacy to inquire if Good Rx was applied since Dr. Floretta and pt discussed at last OV.  Spoke with Ref[facility] with [facility] and he states nothing was applied and gave him good rx information, which is:  Good Rx Coupon Info: BIN [id number] PCN [employer] [id number] Member ID  [id number] Marylee states the cost will be $55/month. Will call the patient at appropriate time to inquire if this is the cost they discussed  Spoke with patient and he states that $55 is doable and better than the Eliquis monthly cost. He asked if anything lower and advised would check with out [facility] to inquire and get back with him today.  Left message for patient to call back to update him that the $55 is the best price  [facility] is aware the [facility]arelto [facility] sent is refused  Spoke with patient and he will go get the medicine [facility] today.

## 2024-05-17 NOTE — Progress Notes (Signed)
 "     [name], PA-C [address] [place], [place]  [address] Phone: [phone]   Primary Care Physician: Benjamin Leni Edyth DELENA, MD  Primary Gastroenterologist:  [name], PA-C / Benjamin Naval, MD   Chief Complaint: Liver lesions       HPI:   Discussed the use of AI scribe software for clinical note transcription with the patient, who gave verbal consent to proceed.  Referred by Benjamin Vega cardiologist to evaluate liver lesions.  He was noted to have multiple large hepatic lesions on Chest CTA imaging which were interpreted as being cystic.  In light of  recently developed unprovoked PEs, Benjamin Vega recommended GI evaluation to rule out of malignancy.  03/30/2024 CTA chest with contrast: 1. Multifocal bilateral pulmonary emboli, better visualized on the study. 2. Interval development of multifocal bilateral ground-glass opacities, possibly representing acute infection or inflammation. 3.  Upper Abdomen: Hepatic cysts. No acute findings.  History of Present Illness Benjamin Vega is a 73 year old male who presents for evaluation of liver cysts found incidentally on a cardiac CT angiogram.  Liver function tests have been normal over the past eight years, with the most recent test in February 2024 showing normal results. No family history of liver problems, and no known allergies to contrast used in imaging studies. No history of claustrophobia or metal implants.  He has a history of a colonoscopy performed in March 2023 during which five small polyps were removed. He is due for a repeat colonoscopy in March 2026. No current gastrointestinal symptoms such as abdominal pain, but he experiences a lot of gas.  Denies rectal bleeding.  He has noticed a slight weight loss from 214 to 212 pounds, attributed to increased physical activity and exercise for back trouble.  He experiences regular constipation, having bowel movements once a day in the mornings after eating. He  occasionally uses over-the-counter laxative or gas pill for relief, but is not on any specific medication for constipation.  04/20/2024 echo LVEF 60 to 65%.  09/18/2021 last colonoscopy by Dr. Eda (for positive Cologuard test): 5 small 2 mm to 3 mm polyps removed.  1 sessile serrated polyp and 4 tubular adenomas.  Internal hemorrhoids.  Diverticulosis.  3-year repeat will be due 08/2024.  Normal colonoscopies in 2006 and 2016.  PMH: Bilateral pulmonary emboli, hypertension, hyperlipidemia.  Currently on Pradaxa .  Current Outpatient Medications  Medication Sig Dispense Refill   amLODipine  (NORVASC ) 10 MG tablet TAKE 1 TABLET DAILY 90 tablet 0   atorvastatin  (LIPITOR) 20 MG tablet Take 1 tablet (20 mg total) by mouth daily. 90 tablet 3   brimonidine (ALPHAGAN) 0.2 % ophthalmic solution INSTILL 1 DROP INTO AFFECTED EYE(S) BY OPHTHALMIC ROUTE EVERY 8 HOURS     carvedilol  (COREG ) 12.5 MG tablet TAKE 1 TABLET TWICE A DAY 180 tablet 0   COMBIGAN 0.2-0.5 % ophthalmic solution Place 1 drop into both eyes every 12 (twelve) hours.      dabigatran  (PRADAXA ) 150 MG CAPS capsule Take 1 capsule (150 mg total) by mouth 2 (two) times daily. 180 capsule 1   spironolactone  (ALDACTONE ) 25 MG tablet TAKE 1 TABLET DAILY 90 tablet 0   timolol (TIMOPTIC) 0.5 % ophthalmic solution 1 drop 2 (two) times daily.     No current facility-administered medications for this visit.    Allergies as of 05/18/2024   (No Known Allergies)    Past Medical History:  Diagnosis Date   Edema    Erectile  dysfunction    HTN (hypertension)    Hypercholesteremia    Hyperlipemia     Past Surgical History:  Procedure Laterality Date   COLONOSCOPY     COLONOSCOPY  09/18/2021   FOOT SURGERY      Review of Systems:    All systems reviewed and negative except where noted in HPI.    Physical Exam:  BP 120/82   Pulse 73   Ht 5' 7 (1.702 m)   Wt 212 lb 8 oz (96.4 kg)   SpO2 97%   BMI 33.28 kg/m  No LMP for male  patient.  General: Well-nourished, well-developed in no acute distress.  Lungs: Clear to auscultation bilaterally. Non-labored. Heart: Regular rate and rhythm, no murmurs rubs or gallops.  Abdomen: Bowel sounds are normal; Abdomen is Soft; No hepatosplenomegaly, masses or hernias;  No Abdominal Tenderness; No guarding or rebound tenderness. Neuro: Alert and oriented x 3.  Grossly intact.  Psych: Alert and cooperative, normal mood and affect.   Imaging Studies: ECHOCARDIOGRAM COMPLETE Result Date: 04/20/2024    ECHOCARDIOGRAM REPORT   Patient Name:   Benjamin Vega Date of Exam: 04/20/2024 Medical Rec #:  [id number]       Height:       67.0 in Accession #:    [id number]      Weight:       214.0 lb Date of Birth:  04/22/1951       BSA:          2.081 m Patient Age:    73 years        BP:           126/84 mmHg Patient Gender: M               HR:           68 bpm. Exam Location:  [address] Procedure: 2D Echo, Cardiac Doppler and Color Doppler (Both Spectral and Color            Flow Doppler were utilized during procedure). Indications:    R07.2 Precordial pain  History:        Patient has prior history of Echocardiogram examinations, most                 recent 02/04/2018. Obesity, Arrythmias:Tachycardia,                 Signs/Symptoms:Precordial pain and Edema; Risk Factors:DOE,                 Hypertension and Dyslipidemia.  Sonographer:    Elsie Bohr RDCS Referring Phys: [id number] GEORGANNA ARCHER IMPRESSIONS  1. Left ventricular ejection fraction, by estimation, is 60 to 65%. The left ventricle has normal function. The left ventricle has no regional wall motion abnormalities. There is moderate asymmetric left ventricular hypertrophy. Left ventricular diastolic parameters are consistent with Grade I diastolic dysfunction (impaired relaxation).  2. Right ventricular systolic function is normal. The right ventricular size is normal. Tricuspid regurgitation signal is inadequate for assessing PA  pressure.  3. The mitral valve is normal in structure. No evidence of mitral valve regurgitation. No evidence of mitral stenosis.  4. The aortic valve is tricuspid. Aortic valve regurgitation is trivial. No aortic stenosis is present.  5. The inferior vena cava is normal in size with greater than 50% respiratory variability, suggesting right atrial pressure of 3 mmHg. FINDINGS  Left Ventricle: Left ventricular ejection fraction, by estimation, is 60 to 65%. The left ventricle has normal  function. The left ventricle has no regional wall motion abnormalities. The left ventricular internal cavity size was normal in size. There is  moderate asymmetric left ventricular hypertrophy. Left ventricular diastolic parameters are consistent with Grade I diastolic dysfunction (impaired relaxation). Indeterminate filling pressures. Right Ventricle: The right ventricular size is normal. No increase in right ventricular wall thickness. Right ventricular systolic function is normal. Tricuspid regurgitation signal is inadequate for assessing PA pressure. Left Atrium: Left atrial size was normal in size. Right Atrium: Right atrial size was normal in size. Pericardium: There is no evidence of pericardial effusion. Mitral Valve: The mitral valve is normal in structure. No evidence of mitral valve regurgitation. No evidence of mitral valve stenosis. Tricuspid Valve: The tricuspid valve is normal in structure. Tricuspid valve regurgitation is not demonstrated. No evidence of tricuspid stenosis. Aortic Valve: The aortic valve is tricuspid. Aortic valve regurgitation is trivial. No aortic stenosis is present. Pulmonic Valve: The pulmonic valve was normal in structure. Pulmonic valve regurgitation is not visualized. No evidence of pulmonic stenosis. Aorta: The aortic root is normal in size and structure. Venous: The inferior vena cava is normal in size with greater than 50% respiratory variability, suggesting right atrial pressure of 3 mmHg.  IAS/Shunts: No atrial level shunt detected by color flow Doppler.  LEFT VENTRICLE PLAX 2D LVIDd:         4.00 cm   Diastology LVIDs:         2.80 cm   LV e' medial:    7.72 cm/s LV PW:         0.90 cm   LV E/e' medial:  10.7 LV IVS:        1.50 cm   LV e' lateral:   7.62 cm/s LVOT diam:     2.00 cm   LV E/e' lateral: 10.9 LV SV:         68 LV SV Index:   32 LVOT Area:     3.14 cm  RIGHT VENTRICLE             IVC RV S prime:     13.20 cm/s  IVC diam: 1.00 cm TAPSE (M-mode): 1.7 cm LEFT ATRIUM             Index        RIGHT ATRIUM           Index LA diam:        3.90 cm 1.87 cm/m   RA Pressure: 3.00 mmHg LA Vol (A2C):   56.5 ml 27.15 ml/m  RA Area:     11.70 cm LA Vol (A4C):   44.9 ml 21.57 ml/m  RA Volume:   26.40 ml  12.68 ml/m LA Biplane Vol: 51.3 ml 24.65 ml/m  AORTIC VALVE LVOT Vmax:   99.00 cm/s LVOT Vmean:  65.400 cm/s LVOT VTI:    0.215 m  AORTA Ao Root diam: 3.50 cm Ao Asc diam:  3.40 cm MITRAL VALVE               TRICUSPID VALVE MV Area (PHT): 3.81 cm    Estimated RAP:  3.00 mmHg MV Decel Time: 199 msec MV E velocity: 82.80 cm/s  SHUNTS MV A velocity: 89.60 cm/s  Systemic VTI:  0.22 m MV E/A ratio:  0.92        Systemic Diam: 2.00 cm Annabella Scarce MD Electronically signed by Annabella Scarce MD Signature Date/Time: 04/20/2024/5:35:54 PM    Final     Labs: CBC  Component Value Date/Time   WBC 5.1 07/25/2022 0840   WBC 4.1 04/12/2015 0738   RBC 4.63 07/25/2022 0840   RBC 4.72 04/12/2015 0738   HGB 13.9 07/25/2022 0840   HCT 42.2 07/25/2022 0840   PLT 221 07/25/2022 0840   MCV 91 07/25/2022 0840   MCH 30.0 07/25/2022 0840   MCH 30.1 04/12/2015 0738   MCHC 32.9 07/25/2022 0840   MCHC 32.8 04/12/2015 0738   RDW 13.9 07/25/2022 0840   LYMPHSABS 1.3 04/12/2015 0738   MONOABS 0.4 04/12/2015 0738   EOSABS 0.1 04/12/2015 0738   BASOSABS 0.0 04/12/2015 0738    CMP     Component Value Date/Time   NA 139 03/12/2024 1242   K 4.0 03/12/2024 1242   CL 101 03/12/2024 1242   CO2 22  03/12/2024 1242   GLUCOSE 102 (H) 03/12/2024 1242   GLUCOSE 107 (H) 05/10/2016 0845   BUN 15 03/12/2024 1242   CREATININE 1.25 03/12/2024 1242   CREATININE 1.31 (H) 05/10/2016 0845   CALCIUM  10.3 (H) 03/12/2024 1242   PROT 7.1 07/25/2022 0840   ALBUMIN 4.3 07/25/2022 0840   AST 23 07/25/2022 0840   ALT 21 07/25/2022 0840   ALKPHOS 77 07/25/2022 0840   BILITOT 0.4 07/25/2022 0840   GFRNONAA 50 (L) 07/17/2020 0832   GFRAA 58 (L) 07/17/2020 0832     Assessment and Plan:   Benjamin Vega is a 73 y.o. y/o male presents for evaluation of:  1.  Liver lesions, thought to be cystic on recent chest CTA - Ordering multiphasic abdominal MRI without and with gadolinium contrast for further evaluation. - Lab CMP  2.  Recent bilateral unprovoked pulmonary emboli; currently on Pradaxa  - Continue follow-up with cardiology, pulmonology, hematology  3.  Mild chronic constipation - Recommend OTC MiraLAX powder 1 capful in a drink once daily or Senokot-S as needed. - Advised increasing water intake to 64 ounces daily. - Encouraged high-fiber diet with fruits, vegetables, whole grains.  4.  History of small adenomatous and sessile serrated colon polyps (last colonoscopy 08/2021). - 3-year repeat surveillance colonoscopy will be due 08/2024.    [name], PA-C  Follow up 3 months with TG (February 2026) to discuss repeat colonoscopy.  Also follow-up based on abdominal MRI results.   "

## 2024-05-18 ENCOUNTER — Other Ambulatory Visit (INDEPENDENT_AMBULATORY_CARE_PROVIDER_SITE_OTHER)

## 2024-05-18 ENCOUNTER — Ambulatory Visit: Admitting: Physician Assistant

## 2024-05-18 ENCOUNTER — Encounter: Payer: Self-pay | Admitting: Physician Assistant

## 2024-05-18 VITALS — BP 120/82 | HR 73 | Ht 67.0 in | Wt 212.5 lb

## 2024-05-18 DIAGNOSIS — K769 Liver disease, unspecified: Secondary | ICD-10-CM

## 2024-05-18 DIAGNOSIS — Z860100 Personal history of colon polyps, unspecified: Secondary | ICD-10-CM

## 2024-05-18 DIAGNOSIS — Z7901 Long term (current) use of anticoagulants: Secondary | ICD-10-CM

## 2024-05-18 DIAGNOSIS — K5904 Chronic idiopathic constipation: Secondary | ICD-10-CM

## 2024-05-18 DIAGNOSIS — R160 Hepatomegaly, not elsewhere classified: Secondary | ICD-10-CM

## 2024-05-18 LAB — COMPREHENSIVE METABOLIC PANEL WITH GFR
ALT: 20 U/L (ref 0–53)
AST: 21 U/L (ref 0–37)
Albumin: 4.6 g/dL (ref 3.5–5.2)
Alkaline Phosphatase: 75 U/L (ref 39–117)
BUN: 13 mg/dL (ref 6–23)
CO2: 27 meq/L (ref 19–32)
Calcium: 9.9 mg/dL (ref 8.4–10.5)
Chloride: 106 meq/L (ref 96–112)
Creatinine, Ser: 1.08 mg/dL (ref 0.40–1.50)
GFR: 67.29 mL/min (ref >60.00)
Glucose, Bld: 107 mg/dL — ABNORMAL HIGH (ref 70–99)
Potassium: 3.6 meq/L (ref 3.5–5.1)
Sodium: 140 meq/L (ref 135–145)
Total Bilirubin: 0.5 mg/dL (ref 0.2–1.2)
Total Protein: 8 g/dL (ref 6.0–8.3)

## 2024-05-18 NOTE — Patient Instructions (Signed)
 Your provider has requested that you go to the basement level for lab work before leaving today. Press B on the elevator. The lab is located at the first door on the left as you exit the elevator.  You have been scheduled for an MRI at [facility] on 05/28/24. Your appointment time is 8:30 am. Please arrive to admitting (at main entrance of the hospital) 30 minutes prior to your appointment time for registration purposes. Please make certain not to have anything to eat or drink 6 hours prior to your test. In addition, if you have any metal in your body, have a pacemaker or defibrillator, please be sure to let your ordering physician know. This test typically takes 45 minutes to 1 hour to complete. Should you need to reschedule, please call [phone] to do so.  Please follow up sooner if symptoms increase or worsen  Due to recent changes in healthcare laws, you may see the results of your imaging and laboratory studies on MyChart before your provider has had a chance to review them.  We understand that in some cases there may be results that are confusing or concerning to you. Not all laboratory results come back in the same time frame and the provider may be waiting for multiple results in order to interpret others.  Please give us  48 hours in order for your provider to thoroughly review all the results before contacting the office for clarification of your results.   Thank you for trusting me with your gastrointestinal care!   [name], PA-C _______________________________________________________  If your blood pressure at your visit was 140/90 or greater, please contact your primary care physician to follow up on this.  _______________________________________________________  If you are age 45 or older, your body mass index should be between 23-30. Your Body mass index is 33.28 kg/m. If this is out of the aforementioned range listed, please consider follow up with your Primary Care  Provider.  If you are age 4 or younger, your body mass index should be between 19-25. Your Body mass index is 33.28 kg/m. If this is out of the aformentioned range listed, please consider follow up with your Primary Care Provider.   ________________________________________________________  The [facility] providers would like to encourage you to use MYCHART to communicate with providers for non-urgent requests or questions.  Due to long hold times on the telephone, sending your provider a message by [facility] may be a faster and more efficient way to get a response.  Please allow 48 business hours for a response.  Please remember that this is for non-urgent requests.  _______________________________________________________

## 2024-05-19 ENCOUNTER — Ambulatory Visit: Payer: Self-pay | Admitting: Physician Assistant

## 2024-05-19 NOTE — Progress Notes (Signed)
 Call and notify patient labs show Normal CMP.  Glucose, kidney test, liver tests, and electrolytes are normal.  Continue with plan for abdominal MRI as scheduled. [name], PA-C

## 2024-05-20 NOTE — Progress Notes (Signed)
 Agree with assessment and plan as outlined.

## 2024-05-24 ENCOUNTER — Other Ambulatory Visit (HOSPITAL_COMMUNITY): Payer: Self-pay

## 2024-05-24 NOTE — Progress Notes (Unsigned)
 DVT Clinic Note  Name: Benjamin Vega     MRN: [id number]     DOB: 07-21-1950     Sex: male  PCP: [name], MD  Today's Visit: Visit Information: Initial Visit  Referred to DVT Clinic by: Hematology - Dr. Pauletta Chihuahua Referred to CPP by: Dr. Magda Reason for referral:  Chief Complaint  Patient presents with   Pulmonary embolism   HISTORY OF PRESENT ILLNESS: Benjamin Vega is a 73 y.o. male with PMH HTN and HLD who presents after diagnosis of PE for medication management. Patient was seen by cardiology on 03/12/24 with complaint of DOE and SOB. Coronary CT was suspicious for pulmonary emboli. CTA on 03/30/24 showed multifocal bilateral pulmonary emboli and hepatic cysts. He was started on treatment with Eliquis  and referred to hematology given no clear provoking factors. Hematology recommended long term anticoagulation as tolerated for unprovoked PE and referred him to DVT Clinic to assist with cost of anticoagulation. Given unprovoked PE, he is being seen by GI to evaluate liver lesions further. Cardiology transitioned him from Eliquis  to Pradaxa  due to cost as he was able to fill Pradaxa  using a GoodRx coupon for ~$55 per month which is affordable for him. [facility] pharmacy test claim shows Eliquis  and Xarelto are both $114 for a 30 day supply due to coinsurance. Pradaxa  is non-formulary.  Today, patient reports he is tolerating treatment with Pradaxa  well. Denies abnormal bleeding or bruising. Denies missed doses of Pradaxa . He reports cost of $55 per month is affordable for him. Reports that Eliquis  was $458 for the first fill at [facility] and $114 per month would be unaffordable for him. Reports that he has a Medicare agent and he has already elected to stay on his same insurance plan for 2026. Denies SOB and chest pain. He has returned to his normal walking routine with no issues.   Positive Thrombotic Risk Factors: Older Age Bleeding Risk Factors: Age >65 years, Anticoagulant  therapy  Negative Thrombotic Risk Factors: Previous VTE, Recent surgery (within 3 months), Recent trauma (within 3 months), Recent admission to hospital with acute illness (within 3 months), Paralysis, paresis, or recent plaster cast immobilization of lower extremity, Central venous catheterization, Pregnancy, Bed rest >72 hours within 3 months, Within 6 weeks postpartum, Recent cesarean section (within 3 months), Estrogen therapy, Sedentary journey lasting >8 hours within 4 weeks, Testosterone therapy, Erythropoiesis-stimulating agent, Recent COVID diagnosis (within 3 months), Known thrombophilic condition, Smoking, Non-malignant, chronic inflammatory condition, Active cancer  Rx Insurance Coverage: Medicare Rx Affordability: Eliquis  and Xarelto are both $114 for a 30 day supply with his insurance due to a coinsurance. Pradaxa  is non-formulary, but he can use the GoodRx coupon to reduce the cost to $55 per month. Rx Assistance Provided: Discussed Medicare plans. No assistance needed at this time. Preferred Pharmacy: Refills for Pradaxa  already sent to patient's preferred pharmacy  Past Medical History:  Diagnosis Date   Edema    Erectile dysfunction    HTN (hypertension)    Hypercholesteremia    Hyperlipemia     Past Surgical History:  Procedure Laterality Date   COLONOSCOPY     COLONOSCOPY  09/18/2021   FOOT SURGERY      Social History   Socioeconomic History   Marital status: Married    Spouse name: Not on file   Number of children: Not on file   Years of education: Not on file   Highest education level: Not on file  Occupational History  Not on file  Tobacco Use   Smoking status: Former    Passive exposure: Never   Smokeless tobacco: Former    Types: Engineer, Drilling   Vaping status: Never Used  Substance and Sexual Activity   Alcohol use: Yes    Alcohol/week: 4.0 standard drinks of alcohol    Types: 4 Shots of liquor per week   Drug use: No   Sexual activity: Not  Currently  Other Topics Concern   Not on file  Social History Narrative   Not on file   Social Drivers of Health   Financial Resource Strain: Not on file  Food Insecurity: No Food Insecurity (04/27/2024)   Hunger Vital Sign    Worried About Running Out of Food in the Last Year: Never true    Ran Out of Food in the Last Year: Never true  Transportation Needs: No Transportation Needs (04/27/2024)   PRAPARE - Administrator, Civil Service (Medical): No    Lack of Transportation (Non-Medical): No  Physical Activity: Not on file  Stress: Not on file  Social Connections: Not on file  Intimate Partner Violence: Not At Risk (04/27/2024)   Humiliation, Afraid, Rape, and Kick questionnaire    Fear of Current or Ex-Partner: No    Emotionally Abused: No    Physically Abused: No    Sexually Abused: No    Family History  Problem Relation Age of Onset   Diabetes Mother    Hypertension Father    Heart disease Father    Esophageal cancer Sister    Colon cancer Neg Hx    Colon polyps Neg Hx    Stomach cancer Neg Hx    Rectal cancer Neg Hx     Allergies as of 05/25/2024   (No Known Allergies)    Current Outpatient Medications on File Prior to Visit  Medication Sig Dispense Refill   amLODipine  (NORVASC ) 10 MG tablet TAKE 1 TABLET DAILY 90 tablet 0   atorvastatin  (LIPITOR) 20 MG tablet Take 1 tablet (20 mg total) by mouth daily. 90 tablet 3   brimonidine (ALPHAGAN) 0.2 % ophthalmic solution INSTILL 1 DROP INTO AFFECTED EYE(S) BY OPHTHALMIC ROUTE EVERY 8 HOURS     carvedilol  (COREG ) 12.5 MG tablet TAKE 1 TABLET TWICE A DAY 180 tablet 0   COMBIGAN 0.2-0.5 % ophthalmic solution Place 1 drop into both eyes every 12 (twelve) hours.      dabigatran  (PRADAXA ) 150 MG CAPS capsule Take 1 capsule (150 mg total) by mouth 2 (two) times daily. 180 capsule 1   spironolactone  (ALDACTONE ) 25 MG tablet TAKE 1 TABLET DAILY 90 tablet 0   timolol (TIMOPTIC) 0.5 % ophthalmic solution 1 drop 2 (two)  times daily.     No current facility-administered medications on file prior to visit.   REVIEW OF SYSTEMS:  Review of Systems  Respiratory:  Negative for shortness of breath.   Cardiovascular:  Negative for chest pain.   PHYSICAL EXAMINATION:  Vitals:   05/25/24 1008  BP: 132/82  Pulse: (!) 57  SpO2: 98%  Weight: 212 lb 8 oz (96.4 kg)    Body mass index is 33.28 kg/m.  Physical Exam Vitals reviewed.  Pulmonary:     Effort: Pulmonary effort is normal.  Neurological:     Mental Status: He is alert.  Psychiatric:        Mood and Affect: Mood normal.      LABS:  CBC     Component Value Date/Time  WBC 5.1 07/25/2022 0840   WBC 4.1 04/12/2015 0738   RBC 4.63 07/25/2022 0840   RBC 4.72 04/12/2015 0738   HGB 13.9 07/25/2022 0840   HCT 42.2 07/25/2022 0840   PLT 221 07/25/2022 0840   MCV 91 07/25/2022 0840   MCH 30.0 07/25/2022 0840   MCH 30.1 04/12/2015 0738   MCHC 32.9 07/25/2022 0840   MCHC 32.8 04/12/2015 0738   RDW 13.9 07/25/2022 0840   LYMPHSABS 1.3 04/12/2015 0738   MONOABS 0.4 04/12/2015 0738   EOSABS 0.1 04/12/2015 0738   BASOSABS 0.0 04/12/2015 0738    Hepatic Function      Component Value Date/Time   PROT 8.0 05/18/2024 0918   PROT 7.1 07/25/2022 0840   ALBUMIN 4.6 05/18/2024 0918   ALBUMIN 4.3 07/25/2022 0840   AST 21 05/18/2024 0918   ALT 20 05/18/2024 0918   ALKPHOS 75 05/18/2024 0918   BILITOT 0.5 05/18/2024 0918   BILITOT 0.4 07/25/2022 0840   BILIDIR 0.10 07/23/2021 0918    Renal Function   Lab Results  Component Value Date   CREATININE 1.08 05/18/2024   CREATININE 1.25 03/12/2024   CREATININE 1.21 07/25/2022    Estimated Creatinine Clearance: 65.4 mL/min (by C-G formula based on SCr of 1.08 mg/dL).   [facility] Vascular Lab Studies:  03/30/24 CT Angio Chest Pulmonary Embolism (PE) W or WO Contrast:  FINDINGS: Cardiovascular: Multiple filling defects identified in the right distal inter lobar, lobar, and segmental pulmonary arteries as  well as a few left segmental and subsegmental pulmonary artery branches. No definite CT evidence for right heart strain on this nongated study, although this would be better evaluated by echocardiography.   Mediastinum/Nodes: No lymphadenopathy.   Lungs/Pleura: Multifocal ground-glass opacities in the central aspect of the bilateral upper and lower lobes. No pleural effusions or pneumothorax.   Upper Abdomen: Hepatic cysts.  No acute findings.   Musculoskeletal: No acute osseous findings.   Review of the MIP images confirms the above findings.  IMPRESSION: 1. Multifocal bilateral pulmonary emboli, better visualized on the study.   2. Interval development of multifocal bilateral ground-glass opacities, possibly representing acute infection or inflammation.   Aortic Atherosclerosis (ICD10-I70.0).  ASSESSMENT: Patient without prior history of DVT diagnosed with bilateral pulmonary emboli on 03/30/24. He was started on treatment with Eliquis  by cardiology and referred to hematology for further work up given no clear provoking factors were identified. Hematology recommended long term anticoagulation as long as tolerated for unprovoked PE. At the time of his hematologist visit he was still on Eliquis  and Dr. Tina recommended considering prophylactic dosing at follow up in January. Hematology referred him to us  for assistance with the cost of anticoagulation. In the meantime since Eliquis  was unaffordable for him, cardiology switched him to Pradaxa  after his first month of Eliquis . Pradaxa  is non-formulary, but he used the GoodRx coupon to reduce the cost to $55 per month which is affordable for him. He has been tolerating Pradaxa  well and denies bleeding. Discussed Eliquis /Xarelto copay of $114 per month which he says is unaffordable. Recommended comparing the cost of Eliquis  on different insurance plans with his Medicare agent. Consider finding a plan that has a copay for Eliquis  instead of  coinsurance and consider enrolling in the Medicare prescription payment plan to spread out cost over the year/avoid higher cost at the beginning of the year due to the deductible. Reviewed the benefits of Eliquis  including lower risk of bleeding and consideration of prophylactic dosing after initial treatment phase.  Reviewed risk of recurrent VTE for patients with unprovoked PE. Patient reported that he had already elected to stay on the same insurance plan for next year and was not interested changing plans or medications at this time. Open enrollment closes December 7th. He was thankful for the information and said he would keep this in mind in the future if he decided to switch medications. Recommend to continue Pradaxa  and follow up with hematology and cardiology as scheduled. All patient's questions were answered.   PLAN: -Continue dabigatran  (Pradaxa ) 150 mg twice daily. -Expected duration of therapy: long term as tolerated. Therapy started on 03/25/2024. -Patient educated on purpose, proper use and potential adverse effects of dabigatran  (Pradaxa ). -Discussed importance of taking medication around the same time every day. -Advised patient of medications to avoid (NSAIDs, aspirin  doses >100 mg daily). -Educated that Tylenol (acetaminophen) is the preferred analgesic to lower the risk of bleeding. -Advised patient to alert all providers of anticoagulation therapy prior to starting a new medication or having a procedure. -Emphasized importance of monitoring for signs and symptoms of bleeding (abnormal bruising, prolonged bleeding, nose bleeds, bleeding from gums, discolored urine, black tarry stools). -Educated patient to present to the ED if emergent signs and symptoms of new thrombosis occur.  Follow up: No further follow up in DVT Clinic needed, he will follow up with hematology and cardiology as scheduled  Izetta Henry, PharmD [facility] [phone]

## 2024-05-25 ENCOUNTER — Other Ambulatory Visit (HOSPITAL_COMMUNITY): Payer: Self-pay

## 2024-05-25 ENCOUNTER — Ambulatory Visit: Attending: Vascular Surgery | Admitting: Pharmacist

## 2024-05-25 VITALS — BP 132/82 | HR 57 | Wt 212.5 lb

## 2024-05-25 DIAGNOSIS — I2699 Other pulmonary embolism without acute cor pulmonale: Secondary | ICD-10-CM | POA: Insufficient documentation

## 2024-05-25 NOTE — Patient Instructions (Addendum)
-   Continue taking Pradaxa  150 mg twice daily - Keep your follow up with your cardiologist and hematologist - Consider talking with your medicare agent regarding plans that have better coverage for Eliquis

## 2024-05-28 ENCOUNTER — Ambulatory Visit (HOSPITAL_COMMUNITY)
Admission: RE | Admit: 2024-05-28 | Discharge: 2024-05-28 | Disposition: A | Source: Ambulatory Visit | Attending: Physician Assistant | Admitting: Physician Assistant

## 2024-05-28 DIAGNOSIS — K769 Liver disease, unspecified: Secondary | ICD-10-CM

## 2024-05-28 MED ORDER — GADOBUTROL 1 MMOL/ML IV SOLN
9.0000 mL | Freq: Once | INTRAVENOUS | Status: AC | PRN
  Administered 2024-05-28: 9 mL via INTRAVENOUS

## 2024-05-30 NOTE — Progress Notes (Signed)
 Please call and notify patient his abdominal MRI shows: 1.  Multiple scattered benign cysts throughout the liver.  There are no suspicious liver masses or lesions.  No evidence of cancer. 2.  1 small gallstone which is not worrisome.  No evidence of gallbladder infection. 3.  Normal pancreas. 4.  Benign cyst in the left kidney. 5.  Tiny hiatal hernia.  Otherwise normal stomach and intestines.  No enlarged lymph nodes. 6.  Nothing worrisome. Continue with current plan for follow-up office visit in 3 months. [name], PA-C

## 2024-05-31 NOTE — Telephone Encounter (Signed)
 Patient returning call Requesting a call back  Please advise  Thank you

## 2024-06-30 NOTE — Progress Notes (Signed)
 " Cardiology Office Note:   Date:  06/30/2024  ID:  Benjamin Vega, DOB [birth year], MRN [id number] PCP: [name], MD  [facility] Providers Cardiologist:  Georganna Archer, MD { Chief Complaint:  Chief Complaint  Patient presents with   Follow-up      History of Present Illness:   Benjamin Vega is a 74 y.o. male with a PMH of nonobstructive CAD, HTN, HLD, obesity, PE (on Pradaxa ) who presents for follow up.   Interval History 07/01/24: - Patient presents for follow-up of PE.  Has been taking Pradaxa  as prescribed but states that it makes him feel very itchy.  No bleeding. - His shortness of breath has resolved and he is back to exercising and feels great otherwise. - He follows up with hematology in a couple days.     Past Medical History:  Diagnosis Date   Edema    Erectile dysfunction    HTN (hypertension)    Hypercholesteremia    Hyperlipemia      Studies Reviewed:    EKG: No new ECG       Cardiac Studies & Procedures   ______________________________________________________________________________________________   STRESS TESTS  MYOCARDIAL PERFUSION IMAGING 02/04/2018  Interpretation Summary  Nuclear stress EF: 69%. Normal wall motion.  There was no ST segment deviation noted during stress.  There were no perfusion defects identified.  This is a low risk study.  [name], MD   ECHOCARDIOGRAM  ECHOCARDIOGRAM COMPLETE 04/20/2024  Narrative ECHOCARDIOGRAM REPORT    Patient Name:   Benjamin Vega Date of Exam: 04/20/2024 Medical Rec #:  [id number]       Height:       67.0 in Accession #:    [id number]      Weight:       214.0 lb Date of Birth:  1951/05/02       BSA:          2.081 m Patient Age:    73 years        BP:           126/84 mmHg Patient Gender: M               HR:           68 bpm. Exam Location:  [address]  Procedure: 2D Echo, Cardiac Doppler and Color Doppler (Both Spectral and Color Flow Doppler were  utilized during procedure).  Indications:    R07.2 Precordial pain  History:        Patient has prior history of Echocardiogram examinations, most recent 02/04/2018. Obesity, Arrythmias:Tachycardia, Signs/Symptoms:Precordial pain and Edema; Risk Factors:DOE, Hypertension and Dyslipidemia.  Sonographer:    Elsie Bohr RDCS Referring Phys: [id number] GEORGANNA ARCHER  IMPRESSIONS   1. Left ventricular ejection fraction, by estimation, is 60 to 65%. The left ventricle has normal function. The left ventricle has no regional wall motion abnormalities. There is moderate asymmetric left ventricular hypertrophy. Left ventricular diastolic parameters are consistent with Grade I diastolic dysfunction (impaired relaxation). 2. Right ventricular systolic function is normal. The right ventricular size is normal. Tricuspid regurgitation signal is inadequate for assessing PA pressure. 3. The mitral valve is normal in structure. No evidence of mitral valve regurgitation. No evidence of mitral stenosis. 4. The aortic valve is tricuspid. Aortic valve regurgitation is trivial. No aortic stenosis is present. 5. The inferior vena cava is normal in size with greater than 50% respiratory variability, suggesting right atrial pressure of 3 mmHg.  FINDINGS  Left Ventricle: Left ventricular ejection fraction, by estimation, is 60 to 65%. The left ventricle has normal function. The left ventricle has no regional wall motion abnormalities. The left ventricular internal cavity size was normal in size. There is moderate asymmetric left ventricular hypertrophy. Left ventricular diastolic parameters are consistent with Grade I diastolic dysfunction (impaired relaxation). Indeterminate filling pressures.  Right Ventricle: The right ventricular size is normal. No increase in right ventricular wall thickness. Right ventricular systolic function is normal. Tricuspid regurgitation signal is inadequate for assessing PA  pressure.  Left Atrium: Left atrial size was normal in size.  Right Atrium: Right atrial size was normal in size.  Pericardium: There is no evidence of pericardial effusion.  Mitral Valve: The mitral valve is normal in structure. No evidence of mitral valve regurgitation. No evidence of mitral valve stenosis.  Tricuspid Valve: The tricuspid valve is normal in structure. Tricuspid valve regurgitation is not demonstrated. No evidence of tricuspid stenosis.  Aortic Valve: The aortic valve is tricuspid. Aortic valve regurgitation is trivial. No aortic stenosis is present.  Pulmonic Valve: The pulmonic valve was normal in structure. Pulmonic valve regurgitation is not visualized. No evidence of pulmonic stenosis.  Aorta: The aortic root is normal in size and structure.  Venous: The inferior vena cava is normal in size with greater than 50% respiratory variability, suggesting right atrial pressure of 3 mmHg.  IAS/Shunts: No atrial level shunt detected by color flow Doppler.   LEFT VENTRICLE PLAX 2D LVIDd:         4.00 cm   Diastology LVIDs:         2.80 cm   LV e' medial:    7.72 cm/s LV PW:         0.90 cm   LV E/e' medial:  10.7 LV IVS:        1.50 cm   LV e' lateral:   7.62 cm/s LVOT diam:     2.00 cm   LV E/e' lateral: 10.9 LV SV:         68 LV SV Index:   32 LVOT Area:     3.14 cm   RIGHT VENTRICLE             IVC RV S prime:     13.20 cm/s  IVC diam: 1.00 cm TAPSE (M-mode): 1.7 cm  LEFT ATRIUM             Index        RIGHT ATRIUM           Index LA diam:        3.90 cm 1.87 cm/m   RA Pressure: 3.00 mmHg LA Vol (A2C):   56.5 ml 27.15 ml/m  RA Area:     11.70 cm LA Vol (A4C):   44.9 ml 21.57 ml/m  RA Volume:   26.40 ml  12.68 ml/m LA Biplane Vol: 51.3 ml 24.65 ml/m AORTIC VALVE LVOT Vmax:   99.00 cm/s LVOT Vmean:  65.400 cm/s LVOT VTI:    0.215 m  AORTA Ao Root diam: 3.50 cm Ao Asc diam:  3.40 cm  MITRAL VALVE               TRICUSPID VALVE MV Area (PHT):  3.81 cm    Estimated RAP:  3.00 mmHg MV Decel Time: 199 msec MV E velocity: 82.80 cm/s  SHUNTS MV A velocity: 89.60 cm/s  Systemic VTI:  0.22 m MV E/A ratio:  0.92  Systemic Diam: 2.00 cm  Annabella Scarce MD Electronically signed by Annabella Scarce MD Signature Date/Time: 04/20/2024/5:35:54 PM    Final      CT SCANS  CT CORONARY MORPH W/CTA COR W/SCORE 03/17/2024  Addendum 03/25/2024  4:19 PM ADDENDUM REPORT: 03/25/2024 16:16  EXAM: OVER-READ INTERPRETATION  CT CHEST  The following report is an over-read performed by radiologist Dr. Andrea Gasman of [facility] on 03/25/2024. This over-read does not include interpretation of cardiac or coronary anatomy or pathology. The coronary CTA interpretation by the cardiologist is attached.  COMPARISON:  None.  FINDINGS: Vascular: The included aorta is normal in caliber. There are multiple apparent filling defects within the pulmonary arteries in the right lung, for example series 303, image 30 right upper lobe. Right middle and lower lobe series 303 image 64 and 84.  Mediastinum/nodes: No adenopathy or mass. Unremarkable esophagus.  Lungs: No focal airspace disease. No pulmonary nodule. No pleural fluid. The included airways are patent.  Upper abdomen: No acute findings.  Multiple cysts in the liver.  Musculoskeletal: There are no acute or suspicious osseous abnormalities.  IMPRESSION: Multiple apparent filling defects within the pulmonary arteries in the right lung, suspicious for pulmonary emboli. Recommend dedicated CTA of the chest for confirmation.  These results will be called to the ordering clinician or representative by the Radiologist Assistant, and communication documented in the PACS or [employer].   Electronically Signed By: Andrea Gasman M.D. On: 03/25/2024 16:16  Narrative CLINICAL DATA:  This is a 74 year old male with anginal symptoms  EXAM: Cardiac/Coronary   CTA  TECHNIQUE: The patient was scanned on a [employer].  FINDINGS: A 100 kV prospective scan was triggered in the descending thoracic aorta at 111 HU's. Axial non-contrast 3 mm slices were carried out through the heart. The data set was analyzed on a dedicated work station and scored using the Agatson method. Gantry rotation speed was 250 msecs and collimation was .6 mm. No beta blockade and 0.8 mg of sl NTG was given. The 3D data set was reconstructed in 5% intervals of the 67-82 % of the R-R cycle. Diastolic phases were analyzed on a dedicated work station using MPR, MIP and VRT modes. The patient received 80 cc of contrast.  Aorta:  Normal Size.  No calcifications.  No dissection.  Aortic Valve:  Trileaflet.  No calcifications.  Coronary Arteries:  Normal coronary origin.  Right dominance.  RCA is a large dominant artery that gives rise to PDA and PLA. There is minimal (<25%) focal calcified plaque in the proximal RCA.  Left main is a large artery that gives rise to LAD and LCX arteries.  LAD is a large vessel. There is focal mild (25-49%) calcified plaque in the proximal LAD. The mid LAD with diffuse minimal soft plaques. The distal LAD with no plaques  D1 and D2 with no apparent plaques.  LCX is a non-dominant artery that gives rise to one large OM1 branch. There is focal mild calcified plaque in the mid LCX artery.  Coronary Calcium  Score:  Left main: 0  Left anterior descending artery: 36.7  Left circumflex artery: 11.4  Right coronary artery: 28.6  Total: 76.7  Percentile: 52  Other findings:  Normal pulmonary vein drainage into the left atrium.  Normal left atrial appendage without a thrombus.  Normal size of the pulmonary artery.  Very small patent foramen ovale  IMPRESSION: 1. Coronary calcium  score of 76.7. This was 49 percentile for age  and sex matched control.  2. Normal coronary origin with right dominance.  3. CAD-RADS 2.  Mild non-obstructive CAD (25-49%). Consider non-atherosclerotic causes of chest pain. Consider preventive therapy and risk factor modification.  4. Very small patent foramen ovale  The noncardiac portion of this study will be interpreted in separate report by the radiologist.  Electronically Signed: By: Kardie  Tobb D.O. On: 03/18/2024 15:19     ______________________________________________________________________________________________      Risk Assessment/Calculations:              Physical Exam:     VS:  BP 120/76   Pulse 60   Ht 5' 7 (1.702 m)   Wt 214 lb (97.1 kg)   SpO2 97%   BMI 33.52 kg/m      Wt Readings from Last 3 Encounters:  05/25/24 212 lb 8 oz (96.4 kg)  05/18/24 212 lb 8 oz (96.4 kg)  04/27/24 214 lb 6.4 oz (97.3 kg)     GEN: Well nourished, well developed, in no acute distress NECK: No JVD; No carotid bruits CARDIAC: RRR, no murmurs, rubs, gallops RESPIRATORY:  Clear to auscultation without rales, wheezing or rhonchi  ABDOMEN: Soft, non-tender, non-distended, normal bowel sounds EXTREMITIES:  Warm and well perfused, no edema; No deformity, 2+ radial pulses PSYCH: Normal mood and affect   Assessment & Plan  #Unprovoked PE (on Pradaxa ) - The patient is doing very well clinically and his shortness of breath has dramatically improved. - He states that the Pradaxa  seems to be causing his arms to itch very badly and he is wondering how long he can be on this medication. - Since the patient had an unprovoked PE I imagine that he will need to be on OAC for minimum of 6 months if not indefinitely; however, I will have to defer to his hematologist regarding this decision. - I offered to start an antihistamine for the patient, but he would prefer to discuss this with the hematologist first. Follow with hematology Follow-up in 12 months  #Non-obstructive CAD - No chest pain or shortness of breath. - Continue medical therapy. No aspirin  while on  Pradaxa  Continue statin as below  #HTN - Blood pressure is at goal.  No changes. Continue olmesartan  40 mg daily Continue spironolactone  25 mg daily Continue carvedilol  12.5 mg twice daily Continue amlodipine  10 mg daily  #HLD - Last LDL was slightly above goal and we increase his statin. - We will recheck a lipid panel in 1 year.         This note was written with the assistance of a dictation microphone or AI dictation software. Please excuse any typos or grammatical errors.   Signed, Georganna Archer, MD 06/30/2024 10:05 PM    [facility]  "

## 2024-07-01 ENCOUNTER — Other Ambulatory Visit (HOSPITAL_COMMUNITY): Payer: Self-pay

## 2024-07-01 ENCOUNTER — Ambulatory Visit
Attending: Student in an Organized Health Care Education/Training Program | Admitting: Student in an Organized Health Care Education/Training Program

## 2024-07-01 ENCOUNTER — Encounter: Payer: Self-pay | Admitting: Student in an Organized Health Care Education/Training Program

## 2024-07-01 VITALS — BP 120/76 | HR 60 | Ht 67.0 in | Wt 214.0 lb

## 2024-07-01 DIAGNOSIS — I2699 Other pulmonary embolism without acute cor pulmonale: Secondary | ICD-10-CM | POA: Diagnosis present

## 2024-07-01 MED ORDER — FLUZONE HIGH-DOSE 0.5 ML IM SUSY
0.5000 mL | PREFILLED_SYRINGE | Freq: Once | INTRAMUSCULAR | 0 refills | Status: AC
  Filled 2024-07-01: qty 0.5, 1d supply, fill #0

## 2024-07-01 NOTE — Patient Instructions (Signed)
°  Follow-Up: At [facility], you and your health needs are our priority.  As part of our continuing mission to provide you with exceptional heart care, our providers are all part of one team.  This team includes your primary Cardiologist (physician) and Advanced Practice Providers or APPs (Physician Assistants and Nurse Practitioners) who all work together to provide you with the care you need, when you need it.  Your next appointment:   12 month(s)  Provider:   Georganna Archer, MD

## 2024-07-05 NOTE — Progress Notes (Unsigned)
 [facility] CONSULT NOTE  Patient Care Team: [name], MD as PCP - General (Family Medicine) Floretta Mallard, MD as PCP - Cardiology (Cardiology)  ASSESSMENT & PLAN:  74 y.o.male with history of HTN, HLD, glaucoma and PE referred to [facility] for history of acute PE.    First episode: at age 53 Setting: unprovoked Treatment: apixaban    Assessment & Plan   No orders of the defined types were placed in this encounter.   I personally spent a total of *** minutes in the care of the patient today including {Time Based Coding:210964241}.   All questions were answered. The patient knows to call the clinic with any problems, questions or concerns. No barriers to learning was detected.  Benjamin JAYSON Chihuahua, MD 1/12/202610:52 PM  CHIEF COMPLAINTS/PURPOSE OF CONSULTATION:  ***  HISTORY OF PRESENTING ILLNESS:  Benjamin Vega 74 y.o. male is here because of ***  03/30/2024 CTA showed multifocal bilateral pulmonary emboli.  Interval development of multifocal bilateral ground glass opacity possibly representing acute infection or inflammation.   Patient was started on apixaban .  Report of cost is an issue. He was on aspirin  before the diagnosis.    04/20/24 echo showed normal RV cardiac function.   Report he couldn't walk for 2 miles before the diagnosis and after started apixaban  symptoms resolved a few days.  No bleeding, bloody stool.    MEDICAL HISTORY:  Past Medical History:  Diagnosis Date   Edema    Erectile dysfunction    HTN (hypertension)    Hypercholesteremia    Hyperlipemia     SURGICAL HISTORY: Past Surgical History:  Procedure Laterality Date   COLONOSCOPY     COLONOSCOPY  09/18/2021   FOOT SURGERY      SOCIAL HISTORY: Social History   Socioeconomic History   Marital status: Married    Spouse name: Not on file   Number of children: Not on file   Years of education: Not on file   Highest education  level: Not on file  Occupational History   Not on file  Tobacco Use   Smoking status: Former    Passive exposure: Never   Smokeless tobacco: Former    Types: Engineer, Drilling   Vaping status: Never Used  Substance and Sexual Activity   Alcohol use: Yes    Alcohol/week: 4.0 standard drinks of alcohol    Types: 4 Shots of liquor per week   Drug use: No   Sexual activity: Not Currently  Other Topics Concern   Not on file  Social History Narrative   Not on file   Social Drivers of Health   Tobacco Use: Medium Risk (07/01/2024)   Patient History    Smoking Tobacco Use: Former    Smokeless Tobacco Use: Former    Passive Exposure: Never  Programmer, Applications: Not on [employer] Insecurity: No Food Insecurity (04/27/2024)   Epic    Worried About Programme Researcher, Broadcasting/film/video in the Last Year: Never true    [employer] of Food in the Last Year: Never true  Transportation Needs: No Transportation Needs (04/27/2024)   Epic    Lack of Transportation (Medical): No    Lack of Transportation (Non-Medical): No  Physical Activity: Not on file  Stress: Not on file  Social Connections: Not on file  Intimate Partner Violence: Not At Risk (04/27/2024)   Epic    Fear of Current or Ex-Partner: No  Emotionally Abused: No    Physically Abused: No    Sexually Abused: No  Depression (PHQ2-9): Low Risk (04/27/2024)   Depression (PHQ2-9)    PHQ-2 Score: 0  Alcohol Screen: Not on file  Housing: Low Risk (04/27/2024)   Epic    Unable to Pay for Housing in the Last Year: No    Number of Times Moved in the Last Year: 0    Homeless in the Last Year: No  Utilities: Not At Risk (04/27/2024)   Epic    Threatened with loss of utilities: No  Health Literacy: Not on file    FAMILY HISTORY: Family History  Problem Relation Age of Onset   Diabetes Mother    Hypertension Father    Heart disease Father    Esophageal cancer Sister    Colon cancer Neg Hx    Colon polyps Neg Hx    Stomach cancer Neg Hx     Rectal cancer Neg Hx     ALLERGIES:  has no known allergies.  MEDICATIONS:  Current Outpatient Medications  Medication Sig Dispense Refill   amLODipine  (NORVASC ) 10 MG tablet TAKE 1 TABLET DAILY 90 tablet 0   atorvastatin  (LIPITOR) 20 MG tablet Take 1 tablet (20 mg total) by mouth daily. 90 tablet 3   brimonidine (ALPHAGAN) 0.2 % ophthalmic solution INSTILL 1 DROP INTO AFFECTED EYE(S) BY OPHTHALMIC ROUTE EVERY 8 HOURS     carvedilol  (COREG ) 12.5 MG tablet TAKE 1 TABLET TWICE A DAY 180 tablet 0   COMBIGAN 0.2-0.5 % ophthalmic solution Place 1 drop into both eyes every 12 (twelve) hours.      dabigatran  (PRADAXA ) 150 MG CAPS capsule Take 1 capsule (150 mg total) by mouth 2 (two) times daily. 180 capsule 1   olmesartan  (BENICAR ) 40 MG tablet Take 40 mg by mouth daily.     spironolactone  (ALDACTONE ) 25 MG tablet TAKE 1 TABLET DAILY 90 tablet 0   No current facility-administered medications for this visit.    REVIEW OF SYSTEMS:   All relevant systems were reviewed with the patient and are negative.  PHYSICAL EXAMINATION: ECOG PERFORMANCE STATUS: {CHL ONC ECOG PS:[id number]}  There were no vitals filed for this visit. There were no vitals filed for this visit.  GENERAL: alert, no distress and comfortable SKIN: skin color is normal, no jaundice, rashes or significant lesions EYES: sclera clear OROPHARYNX: no exudate, no erythema NECK: supple LYMPH:  no palpable lymphadenopathy in the cervical, axillary regions LUNGS: Effort normal, no respiratory distress.  Clear to auscultation bilaterally HEART: regular rate & rhythm and no lower extremity edema ABDOMEN: soft, non-tender and nondistended Musculoskeletal: no edema NEURO: no focal motor/sensory deficits  LABORATORY DATA:  I have reviewed the data as listed Lab Results  Component Value Date   WBC 5.1 07/25/2022   HGB 13.9 07/25/2022   HCT 42.2 07/25/2022   MCV 91 07/25/2022   PLT 221 07/25/2022   Recent Labs     03/12/24 1242 05/18/24 0918  NA 139 140  K 4.0 3.6  CL 101 106  CO2 22 27  GLUCOSE 102* 107*  BUN 15 13  CREATININE 1.25 1.08  CALCIUM  10.3* 9.9  PROT  --  8.0  ALBUMIN  --  4.6  AST  --  21  ALT  --  20  ALKPHOS  --  75  BILITOT  --  0.5    RADIOGRAPHIC STUDIES: I have personally reviewed the radiological images as listed and agreed with the findings in the  report. No results found.

## 2024-07-06 ENCOUNTER — Inpatient Hospital Stay

## 2024-07-06 ENCOUNTER — Ambulatory Visit: Payer: Self-pay

## 2024-07-06 VITALS — BP 125/78 | HR 70 | Temp 97.9°F | Resp 16 | Ht 67.0 in | Wt 216.0 lb

## 2024-07-06 DIAGNOSIS — R918 Other nonspecific abnormal finding of lung field: Secondary | ICD-10-CM | POA: Insufficient documentation

## 2024-07-06 DIAGNOSIS — Z800 Family history of malignant neoplasm of digestive organs: Secondary | ICD-10-CM | POA: Diagnosis not present

## 2024-07-06 DIAGNOSIS — Z87891 Personal history of nicotine dependence: Secondary | ICD-10-CM | POA: Diagnosis not present

## 2024-07-06 DIAGNOSIS — Z86711 Personal history of pulmonary embolism: Secondary | ICD-10-CM

## 2024-07-06 DIAGNOSIS — R12 Heartburn: Secondary | ICD-10-CM

## 2024-07-06 DIAGNOSIS — Z7901 Long term (current) use of anticoagulants: Secondary | ICD-10-CM | POA: Diagnosis not present

## 2024-07-06 DIAGNOSIS — L299 Pruritus, unspecified: Secondary | ICD-10-CM | POA: Insufficient documentation

## 2024-07-06 LAB — BASIC METABOLIC PANEL - CANCER CENTER ONLY
Anion gap: 12 (ref 5–15)
BUN: 15 mg/dL (ref 8–23)
CO2: 25 mmol/L (ref 22–32)
Calcium: 10.2 mg/dL (ref 8.9–10.3)
Chloride: 103 mmol/L (ref 98–111)
Creatinine: 1.06 mg/dL (ref 0.61–1.24)
GFR, Estimated: >60 mL/min
Glucose, Bld: 126 mg/dL — ABNORMAL HIGH (ref 70–99)
Potassium: 3.8 mmol/L (ref 3.5–5.1)
Sodium: 140 mmol/L (ref 135–145)

## 2024-07-06 LAB — CBC WITH DIFFERENTIAL (CANCER CENTER ONLY)
Abs Immature Granulocytes: 0.01 K/uL (ref 0.00–0.07)
Basophils Absolute: 0 K/uL (ref 0.0–0.1)
Basophils Relative: 1 %
Eosinophils Absolute: 0.1 K/uL (ref 0.0–0.5)
Eosinophils Relative: 3 %
HCT: 43.7 % (ref 39.0–52.0)
Hemoglobin: 14.5 g/dL (ref 13.0–17.0)
Immature Granulocytes: 0 %
Lymphocytes Relative: 28 %
Lymphs Abs: 1.6 K/uL (ref 0.7–4.0)
MCH: 29.7 pg (ref 26.0–34.0)
MCHC: 33.2 g/dL (ref 30.0–36.0)
MCV: 89.4 fL (ref 80.0–100.0)
Monocytes Absolute: 0.5 K/uL (ref 0.1–1.0)
Monocytes Relative: 10 %
Neutro Abs: 3.2 K/uL (ref 1.7–7.7)
Neutrophils Relative %: 58 %
Platelet Count: 180 K/uL (ref 150–400)
RBC: 4.89 MIL/uL (ref 4.22–5.81)
RDW: 15.9 % — ABNORMAL HIGH (ref 11.5–15.5)
WBC Count: 5.5 K/uL (ref 4.0–10.5)
nRBC: 0 % (ref 0.0–0.2)

## 2024-07-06 LAB — IRON AND IRON BINDING CAPACITY (CC-WL,HP ONLY)
Iron: 102 ug/dL (ref 45–182)
Saturation Ratios: 32 % (ref 17.9–39.5)
TIBC: 321 ug/dL (ref 250–450)
UIBC: 219 ug/dL

## 2024-07-06 LAB — VITAMIN B12: Vitamin B-12: 495 pg/mL (ref 180–914)

## 2024-07-06 NOTE — Patient Instructions (Signed)
 Patient education for risk factors and prevention of clotting We talked about modifiable risk factors.  Prevention of clotting like deep vein thrombosis including: Strong risk factors including fractures of lower limb, hospitalization for severe illness, such as heart failure, myocardial infarction, spinal cord injury, major trauma, hip or knee replacement, and previous blood clot. avoid prolonged immobilization and moving extremities every 1-2 hours during long car rides or flights.  Taking a break and moving extremities if working in a job setting with prolonged sitting.   Avoid dehydration, especially in high altitude. Avoid cigarette smoking Maintaining healthy lifestyle to prevent development of diabetes.  Weight loss if BMI over 30.  Regular exercises but not extreme.  Stay hydrated with exercises. Other risk factors for clotting are surgery, hospitalization, inflammatory disease or severe infection, trauma or injuries from inflammatory state and stasis. If developing one-sided leg swelling, pain, color change, chest pain, sudden short of breath, difficulty taking deep breaths, taking deep breath with chest discomfort or pain, dizziness or heart racing sensation, go to the emergency room immediately for evaluation. If you decided to take one pill a day, watch for new symptoms and report to ED immediately. If developing trauma, uncontrolled bleeding, such as bloody stools report ED immediately. Avoid NSAIDs, aspirin  while on blood thinner. Return in about 4 months and earlier if needed.

## 2024-07-06 NOTE — Assessment & Plan Note (Addendum)
 Continue pradaxa  Patient would like to try once daily due to side effects. Discussed precautions

## 2024-07-06 NOTE — Assessment & Plan Note (Addendum)
 Repeat CT without contrast

## 2024-07-12 NOTE — Telephone Encounter (Signed)
-----   Message from Pauletta Chihuahua, MD sent at 07/06/2024  5:40 PM EST ----- Arland please let him know no concerning findings from lab today.  Thank you.

## 2024-07-12 NOTE — Telephone Encounter (Signed)
 Called the patient no answer. LVM with the message from Dr Tina. Phone number left if the patient has any questions. Arland Legions BSN RN

## 2024-11-02 ENCOUNTER — Inpatient Hospital Stay
